# Patient Record
Sex: Female | Born: 1995 | Race: Black or African American | Hispanic: No | Marital: Single | Smoking: Never smoker
Health system: Southern US, Community
[De-identification: ages and names within clinical notes are randomized; demographics above are authoritative.]

## PROBLEM LIST (undated history)

## (undated) DIAGNOSIS — D649 Anemia, unspecified: Secondary | ICD-10-CM

## (undated) HISTORY — PX: BUNIONECTOMY: SHX129

## (undated) HISTORY — PX: ABLATION: SHX5711

## (undated) HISTORY — DX: Anemia, unspecified: D64.9

---

## 2016-12-29 ENCOUNTER — Ambulatory Visit (HOSPITAL_COMMUNITY)
Admission: EM | Admit: 2016-12-29 | Discharge: 2016-12-29 | Disposition: A | Discharge status: Home / Self Care | Payer: BLUE CROSS/BLUE SHIELD | Attending: Family Medicine | Admitting: Family Medicine

## 2016-12-29 ENCOUNTER — Encounter (HOSPITAL_COMMUNITY): Payer: Self-pay | Admitting: *Deleted

## 2016-12-29 DIAGNOSIS — J029 Acute pharyngitis, unspecified: Secondary | ICD-10-CM | POA: Diagnosis not present

## 2016-12-29 MED ORDER — CHLORHEXIDINE GLUCONATE 0.12 % MT SOLN: 15.0000 mL | Freq: Two times a day (BID) | OROMUCOSAL | 0 refills | Status: DC

## 2016-12-29 NOTE — ED Provider Notes (Signed)
  [facility]   [id number] 12/29/16 Arrival Time: 1549   SUBJECTIVE:  Tishara Pizano is a 21 y.o. female who presents to the urgent care with complaint of sore throat for two days.  She has an allergy history to [employer] dough mixture and she did have pizza on Tuesday night. She also has a history of tonsillitis.  Patient has no recent fever, ear pain, cough, rash Patient is a Consulting civil engineer at [employer]     History reviewed. No pertinent past medical history. History reviewed. No pertinent family history. Social History   Social History  . Marital status: Single    Spouse name: N/A  . Number of children: N/A  . Years of education: N/A   Occupational History  . Not on file.   Social History Main Topics  . Smoking status: Never Smoker  . Smokeless tobacco: Never Used  . Alcohol use No  . Drug use: Unknown  . Sexual activity: Not Currently   Other Topics Concern  . Not on file   Social History Narrative  . No narrative on file   No outpatient prescriptions have been marked as taking for the 12/29/16 encounter [facility] Encounter).   No Known Allergies    ROS: As per HPI, remainder of ROS negative.   OBJECTIVE:   Vitals:   12/29/16 1655  BP: 118/72  Pulse: 78  Resp: 18  Temp: 98.6 F (37 C)  TempSrc: Oral  SpO2: 100%     General appearance: alert; no distress Eyes: PERRL; EOMI; conjunctiva normal HENT: normocephalic; atraumatic; TMs normal, canal normal, external ears normal without trauma; nasal mucosa normal; oral mucosa normal Neck: supple Extremities: no cyanosis or edema; symmetrical with no gross deformities Skin: warm and dry Neurologic: normal gait; grossly normal Psychological: alert and cooperative; normal mood and affect       ASSESSMENT & PLAN:  1. Pharyngitis, unspecified etiology     Meds ordered this encounter  Medications  . chlorhexidine (PERIDEX) 0.12 % solution    Sig: Use as directed 15  mLs in the mouth or throat 2 (two) times daily.    Dispense:  120 mL    Refill:  0    Reviewed expectations re: course of current medical issues. Questions answered. Outlined signs and symptoms indicating need for more acute intervention. Patient verbalized understanding. After Visit Summary given.    Procedures:      Elvina Sidle, MD 12/29/16 1700

## 2016-12-29 NOTE — ED Triage Notes (Signed)
Pt reports   Symptoms    Of   sorethroat       And      Tightness  In  His  Throat       After eating     A    Pizza   sev   Days   Ago  She  Reports  She  Is  Allergic  To   Dough     At  This  Time  She  Is   Sitting   Upright  On  The  Exam table   Speaking in   Complete  sentances

## 2016-12-31 LAB — CULTURE, GROUP A STREP (THRC)

## 2017-01-26 ENCOUNTER — Ambulatory Visit (HOSPITAL_COMMUNITY)
Admission: EM | Admit: 2017-01-26 | Discharge: 2017-01-26 | Disposition: A | Discharge status: Home / Self Care | Payer: BLUE CROSS/BLUE SHIELD | Attending: Emergency Medicine | Admitting: Emergency Medicine

## 2017-01-26 ENCOUNTER — Encounter (HOSPITAL_COMMUNITY): Payer: Self-pay | Admitting: Emergency Medicine

## 2017-01-26 ENCOUNTER — Ambulatory Visit (INDEPENDENT_AMBULATORY_CARE_PROVIDER_SITE_OTHER): Payer: BLUE CROSS/BLUE SHIELD

## 2017-01-26 DIAGNOSIS — Z3202 Encounter for pregnancy test, result negative: Secondary | ICD-10-CM | POA: Diagnosis not present

## 2017-01-26 DIAGNOSIS — R1032 Left lower quadrant pain: Secondary | ICD-10-CM

## 2017-01-26 DIAGNOSIS — R1012 Left upper quadrant pain: Secondary | ICD-10-CM | POA: Diagnosis not present

## 2017-01-26 DIAGNOSIS — R109 Unspecified abdominal pain: Secondary | ICD-10-CM | POA: Diagnosis not present

## 2017-01-26 DIAGNOSIS — K5900 Constipation, unspecified: Secondary | ICD-10-CM

## 2017-01-26 LAB — POCT URINALYSIS DIP (DEVICE)
Bilirubin Urine: NEGATIVE
Glucose, UA: NEGATIVE mg/dL
HGB URINE DIPSTICK: NEGATIVE
NITRITE: NEGATIVE
PH: 7 (ref 5.0–8.0)
Protein, ur: NEGATIVE mg/dL
SPECIFIC GRAVITY, URINE: 1.02 (ref 1.005–1.030)
UROBILINOGEN UA: 1 mg/dL (ref 0.0–1.0)

## 2017-01-26 LAB — POCT PREGNANCY, URINE: Preg Test, Ur: NEGATIVE

## 2017-01-26 MED ORDER — DOCUSATE SODIUM 100 MG PO CAPS: 100.0000 mg | ORAL_CAPSULE | Freq: Two times a day (BID) | ORAL | 0 refills | Status: DC | PRN

## 2017-01-26 NOTE — Discharge Instructions (Signed)
Increase hydration and dietary fiber.  May start Metamucil OTC  If pain worsens will return to clinic or go to ED Will send Urine for Culture and call with results

## 2017-01-26 NOTE — ED Provider Notes (Signed)
[facility]    CSN: [id number] Arrival date & time: 01/26/17  1006     History   Chief Complaint Chief Complaint  Patient presents with  . Abdominal Pain    HPI Ashley Herman is a 21 y.o. female presented to clinic with CC of left upper and left lower abdominal pain, radiating to back since last night. Denies N/V/D. Denies fever/chills.  Denies urinary Sx or vaginal discharge.  Reports BM irregular, had small BM today morning, but stool was hard and forced to pass stool.  Abdomen tender to light and deep palpation to left upper and lower quadrant. Mild guarding. No rebound or rigidity appreciated. No acute visible distress.   The history is provided by the patient.  Abdominal Pain  Pain location:  LUQ and LLQ Pain quality: cramping   Pain radiates to:  Back Pain severity:  Moderate Onset quality:  Sudden Duration:  1 day Timing:  Intermittent Progression:  Waxing and waning Chronicity:  New Relieved by:  Nothing Worsened by:  Urination and position changes   History reviewed. No pertinent past medical history.  There are no active problems to display for this patient.   History reviewed. No pertinent surgical history.  OB History    No data available       Home Medications    Prior to Admission medications   Medication Sig Start Date End Date Taking? Authorizing Provider  chlorhexidine (PERIDEX) 0.12 % solution Use as directed 15 mLs in the mouth or throat 2 (two) times daily. 12/29/16   Elvina Sidle, MD  docusate sodium (COLACE) 100 MG capsule Take 1 capsule (100 mg total) by mouth 2 (two) times daily as needed for mild constipation. 01/26/17   Yarlin Breisch, NP    Family History History reviewed. No pertinent family history.  Social History Social History  Substance Use Topics  . Smoking status: Never Smoker  . Smokeless tobacco: Never Used  . Alcohol use No     Allergies   Patient has no known allergies.   Review of  Systems Review of Systems  Constitutional: Negative.   HENT: Negative.   Eyes: Negative.   Respiratory: Negative.   Cardiovascular: Negative.   Gastrointestinal: Positive for abdominal pain.  Genitourinary: Negative.   Neurological: Negative.      Physical Exam Triage Vital Signs ED Triage Vitals [01/26/17 1034]  Enc Vitals Group     BP 128/67     Pulse Rate (!) 109     Resp 20     Temp 98 F (36.7 C)     Temp Source Oral     SpO2 100 %     Weight      Height      Head Circumference      Peak Flow      Pain Score      Pain Loc      Pain Edu?      Excl. in GC?    No data found.   Updated Vital Signs BP 128/67 (BP Location: Left Arm)   Pulse (!) 109   Temp 98 F (36.7 C) (Oral)   Resp 20   LMP 01/13/2017 (Exact Date)   SpO2 100%   Visual Acuity Right Eye Distance:   Left Eye Distance:   Bilateral Distance:    Right Eye Near:   Left Eye Near:    Bilateral Near:     Physical Exam  Constitutional: She is oriented to person, place, and time. She  appears well-developed and well-nourished. No distress.  HENT:  Head: Normocephalic.  Eyes: Pupils are equal, round, and reactive to light. EOM are normal.  Neck: Normal range of motion.  Cardiovascular: Normal rate and regular rhythm.   Pulmonary/Chest: Effort normal and breath sounds normal.  Abdominal: Soft. She exhibits no distension and no mass. There is tenderness (Tenderness to light and deep palpation on LUQ and LLQ). There is guarding. There is no rebound. No hernia.  Neurological: She is alert and oriented to person, place, and time.  Skin: Skin is warm.     UC Treatments / Results  Labs (all labs ordered are listed, but only abnormal results are displayed) Labs Reviewed  POCT URINALYSIS DIP (DEVICE) - Abnormal; Notable for the following:       Result Value   Ketones, ur TRACE (*)    Leukocytes, UA TRACE (*)    All other components within normal limits  POCT PREGNANCY, URINE    EKG  EKG  Interpretation None       Radiology Dg Abd 2 Views  Result Date: 01/26/2017 CLINICAL DATA:  Pain after eating. EXAM: ABDOMEN - 2 VIEW COMPARISON:  No prior . FINDINGS: Soft tissue structures are unremarkable. No bowel distention. Stool noted throughout colon. No free air. No acute bony abnormality . IMPRESSION: No acute abnormality. Electronically Signed   By: Maisie Fushomas  Register   On: 01/26/2017 11:46    Procedures Procedures (including critical care time)  Medications Ordered in UC Medications - No data to display   Initial Impression / Assessment and Plan / UC Course  I have reviewed the triage vital signs and the nursing notes.  Pertinent labs & imaging results that were available during my care of the patient were reviewed by me and considered in my medical decision making (see chart for details).   Sx highly likely from constipation or possible UTI as per leukocyturia on dipstick.  Final Clinical Impressions(s) / UC Diagnoses   Final diagnoses:  Left upper quadrant pain  Abdominal pain, LLQ (left lower quadrant)  Constipation, unspecified constipation type    New Prescriptions New Prescriptions   DOCUSATE SODIUM (COLACE) 100 MG CAPSULE    Take 1 capsule (100 mg total) by mouth 2 (two) times daily as needed for mild constipation.   XR: Abdomen: Soft tissue structures are unremarkable. No bowel distention. Stool noted throughout colon. No free air. No acute bony abnormality  Will send urine for culture and ABX if bacteria in urine.  Controlled Substance Prescriptions [place] Controlled Substance Registry consulted? Not Applicable   Reinaldo RaddleMultani, Kyllie Pettijohn, NP 01/26/17 1159

## 2017-01-26 NOTE — ED Triage Notes (Signed)
Pt c/o intermittent lower abd pain onset yest .... Sts pain radiates to the back  Sts she is unable to lay on side or back when having pain  Repots abd discomfort when she voids urine but denies dysuria, hematuria, urinary fre/urgency, n/v, d, vag d/c  LBM = today but was straining.   Sexually active in a monogamous relationship x2 months and uses condoms all the time.   A&O x4... NAD... Ambulatory

## 2017-01-31 ENCOUNTER — Encounter (HOSPITAL_COMMUNITY): Payer: Self-pay

## 2017-01-31 ENCOUNTER — Emergency Department (HOSPITAL_COMMUNITY)
Admission: EM | Admit: 2017-01-31 | Discharge: 2017-02-01 | Disposition: A | Discharge status: Home / Self Care | Payer: BLUE CROSS/BLUE SHIELD | Attending: Emergency Medicine | Admitting: Emergency Medicine

## 2017-01-31 DIAGNOSIS — R1031 Right lower quadrant pain: Secondary | ICD-10-CM | POA: Diagnosis present

## 2017-01-31 DIAGNOSIS — N83201 Unspecified ovarian cyst, right side: Secondary | ICD-10-CM | POA: Insufficient documentation

## 2017-01-31 LAB — URINALYSIS, ROUTINE W REFLEX MICROSCOPIC
Bilirubin Urine: NEGATIVE
GLUCOSE, UA: NEGATIVE mg/dL
HGB URINE DIPSTICK: NEGATIVE
Ketones, ur: 20 mg/dL — AB
Leukocytes, UA: NEGATIVE
NITRITE: NEGATIVE
PH: 5 (ref 5.0–8.0)
Protein, ur: 100 mg/dL — AB
SPECIFIC GRAVITY, URINE: 1.03 (ref 1.005–1.030)

## 2017-01-31 LAB — CBC
HCT: 33.6 % — ABNORMAL LOW (ref 36.0–46.0)
Hemoglobin: 10.7 g/dL — ABNORMAL LOW (ref 12.0–15.0)
MCH: 26 pg (ref 26.0–34.0)
MCHC: 31.8 g/dL (ref 30.0–36.0)
MCV: 81.6 fL (ref 78.0–100.0)
PLATELETS: 498 10*3/uL — AB (ref 150–400)
RBC: 4.12 MIL/uL (ref 3.87–5.11)
RDW: 14.9 % (ref 11.5–15.5)
WBC: 13.7 10*3/uL — ABNORMAL HIGH (ref 4.0–10.5)

## 2017-01-31 LAB — COMPREHENSIVE METABOLIC PANEL
ALBUMIN: 3.8 g/dL (ref 3.5–5.0)
ALK PHOS: 59 U/L (ref 38–126)
ALT: 10 U/L — ABNORMAL LOW (ref 14–54)
ANION GAP: 10 (ref 5–15)
AST: 18 U/L (ref 15–41)
BILIRUBIN TOTAL: 0.4 mg/dL (ref 0.3–1.2)
BUN: 5 mg/dL — AB (ref 6–20)
CALCIUM: 9.1 mg/dL (ref 8.9–10.3)
CO2: 22 mmol/L (ref 22–32)
CREATININE: 0.69 mg/dL (ref 0.44–1.00)
Chloride: 102 mmol/L (ref 101–111)
GFR calc non Af Amer: >60 mL/min (ref >60)
GLUCOSE: 96 mg/dL (ref 65–99)
Potassium: 3.5 mmol/L (ref 3.5–5.1)
Sodium: 134 mmol/L — ABNORMAL LOW (ref 135–145)
TOTAL PROTEIN: 7.9 g/dL (ref 6.5–8.1)

## 2017-01-31 LAB — I-STAT BETA HCG BLOOD, ED (MC, WL, AP ONLY)

## 2017-01-31 LAB — LIPASE, BLOOD: Lipase: 23 U/L (ref 11–51)

## 2017-01-31 MED ORDER — IOPAMIDOL (ISOVUE-300) INJECTION 61%
INTRAVENOUS | Status: AC
  Administered 2017-02-01: 100 mL
  Filled 2017-01-31: qty 100

## 2017-01-31 NOTE — ED Provider Notes (Signed)
[facility] EMERGENCY DEPARTMENT Provider Note   CSN: [id number] Arrival date & time: 01/31/17  1654     History   Chief Complaint Chief Complaint  Patient presents with  . Abdominal Pain  . Back Pain    HPI Ashley Herman is a 21 y.o. female.  Patient presents for evaluation of RLQ abdominal pain x 3 days. She denies fever, nausea, vomiting or diarrhea. She reports anorexia for the first 2 days. No vaginal discharge or urinary symptoms. She reports certain positions, going over bumps in the road make the pain worse. She was seen last week at Urgent Care with left sided abdominal pain and diagnosed with constipation. She states she took the medications and recommendations for treatment and feels condition has resolved. Pain radiates to right lower back. No flank pain.   The history is provided by the patient. No language interpreter was used.  Abdominal Pain   Pertinent negatives include fever, diarrhea, nausea, vomiting, constipation and dysuria.  Back Pain   Associated symptoms include abdominal pain. Pertinent negatives include no chest pain, no fever, no dysuria, no pelvic pain and no weakness.    History reviewed. No pertinent past medical history.  There are no active problems to display for this patient.   History reviewed. No pertinent surgical history.  OB History    No data available       Home Medications    Prior to Admission medications   Medication Sig Start Date End Date Taking? Authorizing Provider  chlorhexidine (PERIDEX) 0.12 % solution Use as directed 15 mLs in the mouth or throat 2 (two) times daily. Patient not taking: Reported on 01/31/2017 12/29/16   Elvina Sidle, MD  docusate sodium (COLACE) 100 MG capsule Take 1 capsule (100 mg total) by mouth 2 (two) times daily as needed for mild constipation. Patient not taking: Reported on 01/31/2017 01/26/17   Reinaldo Raddle, NP    Family History No family history on  file.  Social History Social History  Substance Use Topics  . Smoking status: Never Smoker  . Smokeless tobacco: Never Used  . Alcohol use No     Allergies   Patient has no known allergies.   Review of Systems Review of Systems  Constitutional: Positive for appetite change. Negative for chills and fever.  Respiratory: Negative.  Negative for shortness of breath.   Cardiovascular: Negative.  Negative for chest pain.  Gastrointestinal: Positive for abdominal pain. Negative for constipation, diarrhea, nausea and vomiting.  Genitourinary: Negative.  Negative for dysuria, flank pain, pelvic pain and vaginal discharge.  Musculoskeletal: Positive for back pain.  Skin: Negative.   Neurological: Negative.  Negative for weakness and light-headedness.     Physical Exam Updated Vital Signs BP 119/62   Pulse 97   Temp 98.3 F (36.8 C) (Oral)   Resp 16   Ht 5\' 1"  (1.549 m)   Wt 51.7 kg (114 lb)   LMP 01/13/2017 (Exact Date)   SpO2 98%   BMI 21.54 kg/m   Physical Exam  Constitutional: She is oriented to person, place, and time. She appears well-developed and well-nourished.  Neck: Normal range of motion.  Pulmonary/Chest: Effort normal.  Abdominal: Soft. She exhibits no distension and no mass. There is tenderness (Right sided abdominal tenderness with guarding of the RLQ. ). There is guarding.  Musculoskeletal: Normal range of motion.  Neurological: She is alert and oriented to person, place, and time.  Skin: Skin is warm and dry.  ED Treatments / Results  Labs (all labs ordered are listed, but only abnormal results are displayed) Labs Reviewed  COMPREHENSIVE METABOLIC PANEL - Abnormal; Notable for the following:       Result Value   Sodium 134 (*)    BUN 5 (*)    ALT 10 (*)    All other components within normal limits  CBC - Abnormal; Notable for the following:    WBC 13.7 (*)    Hemoglobin 10.7 (*)    HCT 33.6 (*)    Platelets 498 (*)    All other components  within normal limits  URINALYSIS, ROUTINE W REFLEX MICROSCOPIC - Abnormal; Notable for the following:    APPearance HAZY (*)    Ketones, ur 20 (*)    Protein, ur 100 (*)    Bacteria, UA FEW (*)    Squamous Epithelial / LPF 0-5 (*)    All other components within normal limits  LIPASE, BLOOD  I-STAT BETA HCG BLOOD, ED ([facility], [facility], [facility] ONLY)    EKG  EKG Interpretation None       Radiology No results found.  Procedures Procedures (including critical care time)  Medications Ordered in ED Medications - No data to display   Initial Impression / Assessment and Plan / ED Course  I have reviewed the triage vital signs and the nursing notes.  Pertinent labs & imaging results that were available during my care of the patient were reviewed by me and considered in my medical decision making (see chart for details).     Patient presents with onset of RLQ abdominal pain x 4 days. She has had anorexia, although improved. No fever. She reports pain when going over bumps in the road.   On exam, pain localizes to the RLQ and is referred to RLQ throughout the abdomen. CT ordered to r/o appy. No vaginal symptoms.   CT negative for appy, positive for right ovarian cyst. Do not suspect infection. Will discharge home with recommendation for OB/GYN follow up.    Final Clinical Impressions(s) / ED Diagnoses   Final diagnoses:  None   1. Right ovarian cyst.  New Prescriptions New Prescriptions   No medications on file     Elpidio AnisUpstill, Sal Spratley, Cordelia Poche-C 02/02/17 0755    Vanetta MuldersZackowski, Scott, MD 02/02/17 [phone]

## 2017-01-31 NOTE — ED Triage Notes (Signed)
Pt reports RIGHT groin and right lower back pain since Saturday. Denies N/V/D. No urinary symptoms.

## 2017-02-01 ENCOUNTER — Emergency Department (HOSPITAL_COMMUNITY): Payer: BLUE CROSS/BLUE SHIELD

## 2017-02-01 MED ORDER — IBUPROFEN 600 MG PO TABS: 600.0000 mg | ORAL_TABLET | Freq: Four times a day (QID) | ORAL | 0 refills | Status: DC | PRN

## 2017-02-01 NOTE — Discharge Instructions (Signed)
Follow up with gynecology if pain does not improve over the next few days. Return to the emergency department with any fever, severe pain or new concern.

## 2017-02-01 NOTE — ED Notes (Signed)
Patient transported to CT 

## 2017-03-01 ENCOUNTER — Ambulatory Visit: Payer: BLUE CROSS/BLUE SHIELD | Admitting: Family Medicine

## 2017-03-01 ENCOUNTER — Encounter: Payer: Self-pay | Admitting: Family Medicine

## 2017-03-01 VITALS — BP 96/56 | HR 93 | Temp 98.0°F | Resp 16 | Ht 61.0 in | Wt 112.0 lb

## 2017-03-01 DIAGNOSIS — R42 Dizziness and giddiness: Secondary | ICD-10-CM | POA: Diagnosis not present

## 2017-03-01 DIAGNOSIS — R5383 Other fatigue: Secondary | ICD-10-CM | POA: Diagnosis not present

## 2017-03-01 DIAGNOSIS — N921 Excessive and frequent menstruation with irregular cycle: Secondary | ICD-10-CM

## 2017-03-01 LAB — GLUCOSE, CAPILLARY: Glucose-Capillary: 90 mg/dL (ref 65–99)

## 2017-03-01 NOTE — Patient Instructions (Addendum)
We will follow-up by phone with any abnormal laboratory results. Recommend a balanced diet divided over 5-6 small meals throughout the day.  Supplement meals with protein shakes during times when it is difficult to tolerate solids.  Also recommend a daily multivitamin. Follow-up in gynecology as previously scheduled. Multivitamin with Minerals and Iron formulations (oral solid dosage forms) What is this medicine? MULTIVITAMIN with MINERAL and IRON combinations are used to help provide good nutrition. This medicine may be used for other purposes; ask your health care provider or pharmacist if you have questions. COMMON BRAND NAME(S): Active FE, Androvite, Bacmin, Caromega, Centrum, Teacher, English as a foreign language, [employer], Administrator, sports, Government social research officer with [employer], Daily Multiple, Daily Multiple Vitamins with Minerals, Daily Vite plus Iron, Ferrocite Plus, Generix-T, Geritol, Geritol Multivitamin, Integra Plus, Megavite Multivitamin, Multilex, Multilex T&M, Multivitamin With Minerals, Niva-Plus, Nu-Iron V, One-Daily, Optivite PMT, PNV Prenatal Plus Multivitamin, Prenatal Plus, Prenatal Plus Low Iron, PreNatal Vitamins Plus, PrePLUS, Strovite Forte, Super [place], [place] Advanced, Thera-M, Thera-Tab M High Potency, Therapeutic-M, Therems, Therems-H, Therems-M, Uni-Thera M Advanced, Unicap M, Unicap SR, Unicomplex-M, Vitafol, Vol-Plus What should I tell my health care provider before I take this medicine? They need to know if you have any of these conditions: -bleeding or clotting disorder -history of anemia of any type -other chronic health condition -an unusual or allergic reaction to vitamins, minerals, other medicines, foods, dyes, or preservatives -pregnant or trying to get pregnant -breast-feeding How should I use this medicine? Take by mouth with a glass of water. May take with food. Some brands, but not all, may be chewed before swallowing. Follow  the directions on the prescription or product label. The usual dose is one tablet once a day. Do not take your medicine more often than directed. Contact your pediatrician regarding the use of this medicine in children. Special care may be needed. Overdosage: If you think you have taken too much of this medicine contact a poison control center or emergency room at once. NOTE: This medicine is only for you. Do not share this medicine with others. What if I miss a dose? If you miss a dose, take it as soon as you can. If it is almost time for your next dose, take only that dose. Do not take double or extra doses. What may interact with this medicine? -antacids -cefdinir -cefditoren -etidronate -fluoroquinolone antibiotics (examples: ciprofloxacin, gatifloxacin, levofloxacin) -levodopa -tetracycline antibiotics (examples: doxycycline, minocycline, tetracycline) -thyroid hormones -warfarin This list may not describe all possible interactions. Give your health care provider a list of all the medicines, herbs, non-prescription drugs, or dietary supplements you use. Also tell them if you smoke, drink alcohol, or use illegal drugs. Some items may interact with your medicine. What should I watch for while using this medicine? Get regular checks on your progress. Remember that vitamin and mineral supplements do not replace the need for good nutrition from a balanced diet. Talk with your health care professional if you have questions or need advice. What side effects may I notice from receiving this medicine? Side effects that you should report to your doctor or health care professional as soon as possible: -allergic reaction such as skin rash or difficulty breathing -vomiting Side effects that usually do not require medical attention (report to your doctor or health care professional if they continue or are bothersome): -nausea -stomach upset This list may not describe all possible side effects. Call  your doctor for medical advice about side effects. You may report side effects  to FDA at 1-800-FDA-1088. Where should I keep my medicine? Keep out of the reach of children. Most vitamins and minerals should be stored at controlled room temperature between 15 and 30 degrees C (59 and 86 degrees F). Check your specific product directions. Protect from heat and moisture. Throw away any unused medicine after the expiration date. NOTE: This sheet is a summary. It may not cover all possible information. If you have questions about this medicine, talk to your doctor, pharmacist, or health care provider.  2018 Elsevier/Gold Standard (2014-10-23 08:55:11) Protein Supplement Powders What are protein supplement powders? Protein is one of the major components of the human diet. It helps your body to build tissue, muscle, red blood cells, enzymes, antibodies, and hormones. Athletes need to include more protein in their diets than nonathletes do. Athletes place more stress on their muscles, and extra protein helps to repair these muscles. In female athletes, extra protein also helps to maintain regular menstrual cycles. Most of an athlete's daily protein needs can be met through food. However, protein supplement powders are a safe and effective way to increase daily protein intake, particularly if you:  Eat a vegan diet.  Are actively trying to build muscle mass.  Are growing.  Are recovering from injury.  Protein supplement powders are supplements that contain large amounts of protein. They can be easily digested, absorbed, and used by the body. These proteins contain the essential building blocks (amino acids) that are central to muscle function, repair, and recovery. Protein supplement powders most often consist of:  Animal-based proteins, including: ? Whey. ? Casein. ? Milk. ? Egg.  Vegetable-based proteins, including soy.  How do I use protein supplement powders? Many protein supplement  powders need to be mixed with water. Some products come premixed in a liquid form. Specific proportions and directions vary by product. Follow the directions that are provided on the label. Eat carbohydrates and protein, either in food or supplement form, before and after an exercise session. Consuming a protein supplement powder before lifting weights or doing resistance training helps to build muscles (protein synthesis) and may limit muscle damage during exercise. Eating a protein supplement powder within three hours after exercise can maximize muscle protein synthesis. Before and after a workout, eat protein in a ratio of one protein for every three carbohydrates. For example, one serving of protein powder that contains 20 grams of protein should be consumed with 60 grams of carbohydrates. This is equivalent to one banana and one piece of toast. How much protein supplement powder should I take? Follow the instructions on the product label for recommended serving sizes and daily servings. Protein supplement powders should be taken in addition to, not in place of, a balanced diet to meet daily protein needs. Daily protein recommendations for adults are as follows:  Average adults should consume around 0.36 grams of protein daily per pound of body weight. For example, a 150-pound adult should consume approximately 54 grams of protein each day.  Athletes should consume 0.45-0.73 grams of protein daily per pound of body weight. For example, a 150-pound athlete should consume 67.5-109.5 grams of protein each day.  What are the risks of taking protein supplement powders? The risks of taking a protein supplement powder may vary depending on the type of protein in the supplement. Risks may include:  Allergies. ? If you have an allergy to cow's milk, avoid protein supplements that contain milk, whey, or casein. ? If you have an allergy to soy, avoid protein  supplements that contain soy or lecithin.  Extra  stress on your kidneys, if you have kidney disease or diabetes.  If you eat too much protein, or if your protein intake is not balanced with carbohydrates and other nutrients, you may have:  Gastrointestinal discomfort.  Constipation.  Diarrhea.  Tissue dehydration.  Nausea.  Headache.  Fatigue.  What are some tips for using protein supplement powders?  Always talk with your health care provider before starting protein supplement powders.  Eat a balanced diet of fruits, vegetables, meat, dairy, and grains, in addition to taking a protein supplement powder.  Drink enough fluid to keep your urine clear or pale yellow. Drink fluids before, during, and after exercise and throughout the day.  Take protein supplements only as directed. Do not take a higher dose than was recommended for you. Protein supplement powders are not regulated. Some products may contain impurities or ingredients that differ from those on the label.  Try to meet your daily protein needs with food. Eat protein-rich foods along with protein supplement powders. Foods high in protein include ? Lean meat, poultry, and fish. ? Eggs. ? Milk, yogurt, and cheese. ? Soybeans and tofu. ? Beans and lentils. This information is not intended to replace advice given to you by your health care provider. Make sure you discuss any questions you have with your health care provider. Document Released: 03/28/2005 Document Revised: 02/23/2016 Document Reviewed: 09/10/2013 Elsevier Interactive Patient Education  2017 [employer].

## 2017-03-01 NOTE — Progress Notes (Signed)
Subjective:    Patient ID: Ashley Herman, female    DOB: 25-Jan-1996, 21 y.o.   MRN: [id number]  HPI  Ashley Herman, 21 year old female that presents with complaint of 1 month to establish care.  Patient junior at [employer] and has not had a primary provider in the area.  Patient is currently complaining of periodic fatigue and insomnia.  She also endorses a decreased appetite symptoms of fatigue began 2-3 months ago.   Sentinal symptom the patient feels fatigue began with: excessive menstrual bleeding.  She was found to have cyst on ovaries.  Patient was started on oral contraceptive by gynecology.  Oral contraceptive was discontinued due to unwanted side effects.  Symptoms of her fatigue have been change in appetite, diffuse soft tissue aches and pains, fatigue with paradoxical insomnia, general malaise and lack of interest in usual activities. Patient describes the following psychologic symptoms: depression and stress at school.  Patient denies symptoms of arthritis, exercise intolerance, unusual rashes and witnessed or suspected sleep apnea. Severity has been symptoms bothersome, but easily able to carry out all usual work/school/family activities.  Past Medical History:  Diagnosis Date  . Anemia    There is no immunization history on file for this patient.  Social History   Socioeconomic History  . Marital status: Single    Spouse name: Not on file  . Number of children: Not on file  . Years of education: Not on file  . Highest education level: Not on file  Social Needs  . Financial resource strain: Not on file  . Food insecurity - worry: Not on file  . Food insecurity - inability: Not on file  . Transportation needs - medical: Not on file  . Transportation needs - non-medical: Not on file  Occupational History  . Not on file  Tobacco Use  . Smoking status: Never Smoker  . Smokeless tobacco: Never Used  Substance and Sexual Activity  . Alcohol use: No  . Drug use: No   . Sexual activity: Yes    Birth control/protection: Condom  Other Topics Concern  . Not on file  Social History Narrative  . Not on file  No Known Allergies [facility] 03/01/2017 03/01/2017  Decreased Interest 0 0  Down, Depressed, Hopeless 0 1  PHQ - 2 Score 0 1  Altered sleeping 0 -  Tired, decreased energy 0 -  Change in appetite 0 -  Trouble concentrating 0 -  Moving slowly or fidgety/restless 0 -  Suicidal thoughts 0 -  PHQ-9 Score 0 -  Difficult doing work/chores Not difficult at all -    Review of Systems  Constitutional: Positive for fatigue. Negative for fever and unexpected weight change.  Eyes: Negative for photophobia and visual disturbance.  Respiratory: Negative for wheezing.   Cardiovascular: Negative.  Negative for chest pain, palpitations and leg swelling.  Gastrointestinal: Negative.  Negative for constipation, diarrhea, nausea, rectal pain and vomiting.  Endocrine: Negative for polydipsia, polyphagia and polyuria.  Musculoskeletal: Negative.  Negative for arthralgias, back pain, gait problem, joint swelling, myalgias, neck pain and neck stiffness.  Skin: Negative.   Neurological: Positive for dizziness and light-headedness. Negative for tremors, syncope, weakness and headaches.  Hematological: Negative.   Psychiatric/Behavioral: Negative.  Negative for sleep disturbance and suicidal ideas.       Situational depression      Objective:   Physical Exam  Constitutional: She is oriented to person, place, and time. She appears well-developed and well-nourished.  HENT:  Head: Normocephalic and atraumatic.  Right Ear: External ear normal.  Left Ear: External ear normal.  Nose: Nose normal.  Mouth/Throat: Oropharynx is clear and moist.  Eyes: Conjunctivae and EOM are normal. Pupils are equal, round, and reactive to light.  Neck: Normal range of motion. Neck supple.  Cardiovascular: Normal rate, normal heart sounds and intact distal pulses.   Pulmonary/Chest: Effort normal and breath sounds normal.  Abdominal: Soft. Bowel sounds are normal.  Musculoskeletal: Normal range of motion.  Neurological: She is alert and oriented to person, place, and time. She has normal reflexes.  Skin: Skin is warm and dry.  Psychiatric: She has a normal mood and affect. Her behavior is normal. Judgment and thought content normal.     BP (!) 96/56 (BP Location: Left Arm, Patient Position: Sitting, Cuff Size: Normal)   Pulse 93   Temp 98 F (36.7 C) (Oral)   Resp 16   Ht 5\' 1"  (1.549 m)   Wt 112 lb (50.8 kg)   LMP 02/01/2017   SpO2 100%   BMI 21.16 kg/m  Assessment & Plan:  1. Dizziness, nonspecific Reviewed EKG, borderline. - CBC with Differential - Glucose (CBG) - EKG 12-Lead  2. Menorrhagia with irregular cycle Follow-up in gynecology as previously scheduled.  3. Other fatigue Recommended daily multivitamin and a balanced diet. Body mass index is 21.16 kg/m.  - CBC with Differential - COMPLETE METABOLIC PANEL WITH GFR - Vitamin D, 25-hydroxy - Vitamin B12 - TSH   RTC: 1 month for fatigue.  We will follow-up by phone with any abnormal lab results    Nolon NationsLaChina Moore Yarianna Varble  MSN, FNP-C [facility] [address]  [place], [place] [id number] [phone]

## 2017-03-02 LAB — CBC WITH DIFFERENTIAL/PLATELET
BASOS ABS: 92 {cells}/uL (ref 0–200)
BASOS PCT: 1.5 %
EOS ABS: 616 {cells}/uL — AB (ref 15–500)
Eosinophils Relative: 10.1 %
HEMATOCRIT: 33.9 % — AB (ref 35.0–45.0)
HEMOGLOBIN: 10.9 g/dL — AB (ref 11.7–15.5)
Lymphs Abs: 1976 cells/uL (ref 850–3900)
MCH: 25.4 pg — AB (ref 27.0–33.0)
MCHC: 32.2 g/dL (ref 32.0–36.0)
MCV: 79 fL — AB (ref 80.0–100.0)
MPV: 9.4 fL (ref 7.5–12.5)
Monocytes Relative: 9.8 %
NEUTROS ABS: 2818 {cells}/uL (ref 1500–7800)
Neutrophils Relative %: 46.2 %
Platelets: 532 10*3/uL — ABNORMAL HIGH (ref 140–400)
RBC: 4.29 10*6/uL (ref 3.80–5.10)
RDW: 14.2 % (ref 11.0–15.0)
Total Lymphocyte: 32.4 %
WBC: 6.1 10*3/uL (ref 3.8–10.8)
WBCMIX: 598 {cells}/uL (ref 200–950)

## 2017-03-02 LAB — COMPLETE METABOLIC PANEL WITH GFR
AG RATIO: 1.2 (calc) (ref 1.0–2.5)
ALBUMIN MSPROF: 4.3 g/dL (ref 3.6–5.1)
ALKALINE PHOSPHATASE (APISO): 66 U/L (ref 33–115)
ALT: 17 U/L (ref 6–29)
AST: 22 U/L (ref 10–30)
BILIRUBIN TOTAL: 0.4 mg/dL (ref 0.2–1.2)
BUN: 9 mg/dL (ref 7–25)
CHLORIDE: 101 mmol/L (ref 98–110)
CO2: 28 mmol/L (ref 20–32)
Calcium: 10.3 mg/dL — ABNORMAL HIGH (ref 8.6–10.2)
Creat: 0.72 mg/dL (ref 0.50–1.10)
GFR, EST AFRICAN AMERICAN: 140 mL/min/{1.73_m2} (ref >=60)
GFR, Est Non African American: 121 mL/min/{1.73_m2} (ref >=60)
GLOBULIN: 3.6 g/dL (ref 1.9–3.7)
GLUCOSE: 89 mg/dL (ref 65–99)
Potassium: 4.9 mmol/L (ref 3.5–5.3)
SODIUM: 136 mmol/L (ref 135–146)
TOTAL PROTEIN: 7.9 g/dL (ref 6.1–8.1)

## 2017-03-02 LAB — TSH: TSH: 0.58 mIU/L

## 2017-03-02 LAB — VITAMIN D 25 HYDROXY (VIT D DEFICIENCY, FRACTURES): VIT D 25 HYDROXY: 26 ng/mL — AB (ref 30–100)

## 2017-03-02 LAB — VITAMIN B12: Vitamin B-12: 1244 pg/mL — ABNORMAL HIGH (ref 200–1100)

## 2017-03-07 ENCOUNTER — Other Ambulatory Visit: Payer: Self-pay | Admitting: Family Medicine

## 2017-03-07 ENCOUNTER — Telehealth: Payer: Self-pay

## 2017-03-07 DIAGNOSIS — D509 Iron deficiency anemia, unspecified: Secondary | ICD-10-CM

## 2017-03-07 DIAGNOSIS — E559 Vitamin D deficiency, unspecified: Secondary | ICD-10-CM | POA: Insufficient documentation

## 2017-03-07 MED ORDER — FERROUS SULFATE 325 (65 FE) MG PO TABS: 325.0000 mg | ORAL_TABLET | Freq: Every day | ORAL | 3 refills | Status: DC

## 2017-03-07 MED ORDER — ERGOCALCIFEROL 1.25 MG (50000 UT) PO CAPS: 50000.0000 [IU] | ORAL_CAPSULE | ORAL | 3 refills | Status: DC

## 2017-03-07 NOTE — Telephone Encounter (Signed)
Called and spoke with patient, advised that labs showed vitamin D deficiency and iron deficiency anemia. Advised that patient should start ferrous sulfate 325mg  once daily for iron deficiency and vitamin D once weekly. Recommended that patient include iron rich foods in her diet such as green leafy veggies, organ meats, and beans. Asked patient to keep next scheduled appointment. Thanks!

## 2017-03-07 NOTE — Progress Notes (Signed)
Meds ordered this encounter  Medications  . ferrous sulfate 325 (65 FE) MG tablet    Sig: Take 1 tablet (325 mg total) by mouth daily.    Dispense:  30 tablet    Refill:  3  . ergocalciferol (DRISDOL) 50000 units capsule    Sig: Take 1 capsule (50,000 Units total) by mouth once a week.    Dispense:  12 capsule    Refill:  3    Nolon NationsLaChina Moore Manpreet Kemmer  MSN, FNP-C Patient [facility] [address]  [place], [place] [address] [phone]

## 2017-03-07 NOTE — Telephone Encounter (Signed)
-----   Message from Massie MaroonLachina M Hollis, [place] sent at 03/07/2017  5:27 AM EST ----- Please inform patient that labs showed a vitamin D deficiency and iron deficiency anemia. Will start ferrous sulfate 325 mg daily for iron deficiency. Also recommend an iron rich diet (green leafy veggies, organ meats, beans, etc). Also, will start vitamin D 50, 000 IU weekly.  Will follow up as previously scheduled.   Thanks

## 2017-03-09 ENCOUNTER — Ambulatory Visit (INDEPENDENT_AMBULATORY_CARE_PROVIDER_SITE_OTHER): Payer: BLUE CROSS/BLUE SHIELD | Admitting: Family Medicine

## 2017-03-09 ENCOUNTER — Encounter: Payer: Self-pay | Admitting: Family Medicine

## 2017-03-09 ENCOUNTER — Other Ambulatory Visit (HOSPITAL_COMMUNITY)
Admission: RE | Admit: 2017-03-09 | Discharge: 2017-03-09 | Disposition: A | Discharge status: Home / Self Care | Payer: BLUE CROSS/BLUE SHIELD | Source: Ambulatory Visit | Attending: Family Medicine | Admitting: Family Medicine

## 2017-03-09 VITALS — BP 119/52 | HR 101 | Temp 97.9°F | Resp 16 | Ht 61.0 in | Wt 113.0 lb

## 2017-03-09 DIAGNOSIS — Z202 Contact with and (suspected) exposure to infections with a predominantly sexual mode of transmission: Secondary | ICD-10-CM | POA: Diagnosis present

## 2017-03-09 DIAGNOSIS — R300 Dysuria: Secondary | ICD-10-CM | POA: Diagnosis not present

## 2017-03-09 NOTE — Progress Notes (Signed)
Subjective:    Patient ID: Ashley Herman, female    DOB: 04-Jul-1995, 21 y.o.   MRN: [id number]  HPI Ashley Herman, a 21 year old female presents complaining of a possible exposure to a sexually transmitted infection.  Patient is complaining of dysuria, she states that she was exposed to chlamydia greater than a month ago and was treated with 1 g of azithromycin.  Patient states that dysuria started 2 days ago.  She denies urinary frequency, urgency, vaginal discharge, vaginal burning or dyspareunia.  Patient endorses unprotected sexual intercourse.   Past Medical History:  Diagnosis Date  . Anemia    Social History   Socioeconomic History  . Marital status: Single    Spouse name: Not on file  . Number of children: Not on file  . Years of education: Not on file  . Highest education level: Not on file  Social Needs  . Financial resource strain: Not on file  . Food insecurity - worry: Not on file  . Food insecurity - inability: Not on file  . Transportation needs - medical: Not on file  . Transportation needs - non-medical: Not on file  Occupational History  . Not on file  Tobacco Use  . Smoking status: Never Smoker  . Smokeless tobacco: Never Used  Substance and Sexual Activity  . Alcohol use: No  . Drug use: No  . Sexual activity: Yes    Birth control/protection: Condom  Other Topics Concern  . Not on file  Social History Narrative  . Not on file   There is no immunization history on file for this patient.  No Known Allergies  Review of Systems  Constitutional: Negative.   HENT: Negative.   Eyes: Negative.  Negative for photophobia, redness and visual disturbance.  Respiratory: Negative.   Cardiovascular: Negative.   Gastrointestinal: Negative.   Genitourinary: Positive for dysuria. Negative for difficulty urinating, dyspareunia, enuresis, flank pain, frequency, genital sores, hematuria, menstrual problem and pelvic pain.  Musculoskeletal: Negative.   Skin:  Negative.   Allergic/Immunologic: Negative.   Neurological: Negative.   Hematological: Negative.   Psychiatric/Behavioral: Negative.        Objective:   Physical Exam  Constitutional: She is oriented to person, place, and time.  Neck: Normal range of motion. Neck supple.  Cardiovascular: Normal rate, regular rhythm, normal heart sounds and intact distal pulses.  Pulmonary/Chest: Effort normal and breath sounds normal.  Abdominal: Soft. Bowel sounds are normal.  Neurological: She is alert and oriented to person, place, and time. She has normal reflexes.  Skin: Skin is warm and dry.  Psychiatric: She has a normal mood and affect. Her behavior is normal. Judgment and thought content normal.     BP (!) 119/52 (BP Location: Left Arm, Patient Position: Sitting, Cuff Size: Normal)   Pulse (!) 101   Temp 97.9 F (36.6 C) (Oral)   Resp 16   Ht 5\' 1"  (1.549 m)   Wt 113 lb (51.3 kg)   LMP 02/01/2017   SpO2 100%   BMI 21.35 kg/m  Assessment & Plan:  1. Possible exposure to STD Recommend barrier protection with sexual intercourse.  Given latex condoms.  Discussed the importance of barrier protection at length - Cervicovaginal ancillary only - RPR - HIV antibody (with reflex) - Urine Culture  2. Dysuria - Cervicovaginal ancillary only - Urine Culture   RTC: We will follow-up by phone with any abnormal lab results   Nolon NationsLaChina Moore Shakayla Hickox  MSN, FNP-C [facility] [address] [place], [place] [address] [phone]

## 2017-03-09 NOTE — Patient Instructions (Signed)
We will follow-up by phone with any abnormal lab results

## 2017-03-10 LAB — CERVICOVAGINAL ANCILLARY ONLY
BACTERIAL VAGINITIS: POSITIVE — AB
CHLAMYDIA, DNA PROBE: NEGATIVE
Candida vaginitis: POSITIVE — AB
NEISSERIA GONORRHEA: NEGATIVE
Trichomonas: NEGATIVE

## 2017-03-10 LAB — POCT URINALYSIS DIP (DEVICE)
Bilirubin Urine: NEGATIVE
Glucose, UA: NEGATIVE mg/dL
HGB URINE DIPSTICK: NEGATIVE
KETONES UR: NEGATIVE mg/dL
Nitrite: NEGATIVE
PROTEIN: NEGATIVE mg/dL
SPECIFIC GRAVITY, URINE: 1.015 (ref 1.005–1.030)
UROBILINOGEN UA: 4 mg/dL — AB (ref 0.0–1.0)
pH: 7.5 (ref 5.0–8.0)

## 2017-03-10 LAB — URINE CULTURE
MICRO NUMBER: 81342203
Result:: NO GROWTH
SPECIMEN QUALITY: ADEQUATE

## 2017-03-10 LAB — HIV ANTIBODY (ROUTINE TESTING W REFLEX): HIV: NONREACTIVE

## 2017-03-10 LAB — RPR: RPR: NONREACTIVE

## 2017-03-14 ENCOUNTER — Other Ambulatory Visit: Payer: Self-pay | Admitting: Family Medicine

## 2017-03-14 DIAGNOSIS — B3731 Acute candidiasis of vulva and vagina: Secondary | ICD-10-CM

## 2017-03-14 DIAGNOSIS — N760 Acute vaginitis: Principal | ICD-10-CM

## 2017-03-14 DIAGNOSIS — B373 Candidiasis of vulva and vagina: Secondary | ICD-10-CM

## 2017-03-14 DIAGNOSIS — B9689 Other specified bacterial agents as the cause of diseases classified elsewhere: Secondary | ICD-10-CM

## 2017-03-14 MED ORDER — FLUCONAZOLE 150 MG PO TABS: 150.0000 mg | ORAL_TABLET | Freq: Every day | ORAL | 0 refills | Status: DC

## 2017-03-14 MED ORDER — METRONIDAZOLE 500 MG PO TABS: 500.0000 mg | ORAL_TABLET | Freq: Two times a day (BID) | ORAL | 0 refills | Status: DC

## 2017-03-14 NOTE — Progress Notes (Signed)
Meds ordered this encounter  Medications  . metroNIDAZOLE (FLAGYL) 500 MG tablet    Sig: Take 1 tablet (500 mg total) by mouth 2 (two) times daily.    Dispense:  14 tablet    Refill:  0  . fluconazole (DIFLUCAN) 150 MG tablet    Sig: Take 1 tablet (150 mg total) by mouth daily.    Dispense:  1 tablet    Refill:  0    [name]  MSN, FNP-C Patient [facility] [address]  [place], [place] [address] [phone]

## 2017-03-15 ENCOUNTER — Telehealth: Payer: Self-pay

## 2017-03-15 NOTE — Telephone Encounter (Signed)
Called, patient's mother answered phone. Patient was not available, mom states she will have her call back when she is out of class. Thanks!

## 2017-03-15 NOTE — Telephone Encounter (Signed)
-----   Message from Massie MaroonLachina M Hollis, [place] sent at 03/14/2017  5:03 PM EST ----- Regarding: lab resutls Please inform patient that swab yielded bacterial vaginitis and yeast.  Will start Metronidazole 500 mg BID for 7 days. Refrain from alcohol while taking metronidazole.  Will also treat yeast infection with Diflucan 150 mg times 1.   Please follow up in office as previously scheduled.   Thanks

## 2017-03-15 NOTE — Telephone Encounter (Signed)
Patient returned call. I advised of yeast infection and bacterial vaginitis. Advised to take Metronidazole 500mg  bid for 7 days for the bv and diflucan 150mg  as a single dose for the yeast. Patient verbalized understanding and was encouraged to keep next scheduled appointment. Thanks!

## 2017-04-20 ENCOUNTER — Ambulatory Visit: Payer: BLUE CROSS/BLUE SHIELD | Admitting: Family Medicine

## 2017-05-26 ENCOUNTER — Ambulatory Visit: Payer: BLUE CROSS/BLUE SHIELD | Admitting: Family Medicine

## 2017-05-26 ENCOUNTER — Encounter: Payer: Self-pay | Admitting: Family Medicine

## 2017-05-26 VITALS — BP 114/60 | HR 98 | Temp 98.4°F | Resp 14 | Ht 61.0 in | Wt 116.0 lb

## 2017-05-26 DIAGNOSIS — R05 Cough: Secondary | ICD-10-CM

## 2017-05-26 DIAGNOSIS — E559 Vitamin D deficiency, unspecified: Secondary | ICD-10-CM

## 2017-05-26 DIAGNOSIS — R5383 Other fatigue: Secondary | ICD-10-CM | POA: Diagnosis not present

## 2017-05-26 DIAGNOSIS — J029 Acute pharyngitis, unspecified: Secondary | ICD-10-CM | POA: Diagnosis not present

## 2017-05-26 DIAGNOSIS — R059 Cough, unspecified: Secondary | ICD-10-CM

## 2017-05-26 LAB — POCT RAPID STREP A (OFFICE): Rapid Strep A Screen: NEGATIVE

## 2017-05-26 MED ORDER — GUAIFENESIN-CODEINE 100-10 MG/5ML PO SYRP: 5.0000 mL | ORAL_SOLUTION | Freq: Three times a day (TID) | ORAL | 0 refills | Status: AC | PRN

## 2017-05-26 NOTE — Patient Instructions (Addendum)
Your test was negative for strep throat. Recommend warm salt water gargles as needed. For persistent cough, will start Guai-tussin 5 ml every 8 hours as needed for persistent cough. Increase rest, handwashing, and vitamin C intake.   Will follow up by phone with any abnormal lab results.    Sore Throat When you have a sore throat, your throat may:  Hurt.  Burn.  Feel irritated.  Feel scratchy.  Many things can cause a sore throat, including:  An infection.  Allergies.  Dryness in the air.  Smoke or pollution.  Gastroesophageal reflux disease (GERD).  A tumor.  A sore throat can be the first sign of another sickness. It can happen with other problems, like coughing or a fever. Most sore throats go away without treatment. Follow these instructions at home:  Take over-the-counter medicines only as told by your doctor.  Drink enough fluids to keep your pee (urine) clear or pale yellow.  Rest when you feel you need to.  To help with pain, try: ? Sipping warm liquids, such as broth, herbal tea, or warm water. ? Eating or drinking cold or frozen liquids, such as frozen ice pops. ? Gargling with a salt-water mixture 3-4 times a day or as needed. To make a salt-water mixture, add -1 tsp of salt in 1 cup of warm water. Mix it until you cannot see the salt anymore. ? Sucking on hard candy or throat lozenges. ? Putting a cool-mist humidifier in your bedroom at night. ? Sitting in the bathroom with the door closed for 5-10 minutes while you run hot water in the shower.  Do not use any tobacco products, such as cigarettes, chewing tobacco, and e-cigarettes. If you need help quitting, ask your doctor. Contact a doctor if:  You have a fever for more than 2-3 days.  You keep having symptoms for more than 2-3 days.  Your throat does not get better in 7 days.  You have a fever and your symptoms suddenly get worse. Get help right away if:  You have trouble breathing.  You  cannot swallow fluids, soft foods, or your saliva.  You have swelling in your throat or neck that gets worse.  You keep feeling like you are going to throw up (vomit).  You keep throwing up. This information is not intended to replace advice given to you by your health care provider. Make sure you discuss any questions you have with your health care provider. Document Released: 01/05/2008 Document Revised: 11/22/2015 Document Reviewed: 01/16/2015 Elsevier Interactive Patient Education  [employer]2018 Elsevier Inc.

## 2017-05-26 NOTE — Progress Notes (Signed)
Subjective:     Ashley HoopsJasmine Herman is a 22 y.o. female here for evaluation of a cough and sore throat. Onset of symptoms was 3 days ago.  Patient has not attempted any OTC interventions to improve symptoms. The cough is dry, hoarse and nonproductive and is aggravated by reclining position. Associated symptoms include: sore throat. Patient does not have a history of asthma. Patient does have a history of environmental allergens. Patient has not  traveled recently. Patient does not have a history of smoking. Leavy CellaJasmine says that it has been difficult to swallow.  Patient also has a history of iron deficiency anemia and vitamin D deficiency. She says that she has not been taking medications consistently over the past month.  Past Medical History:  Diagnosis Date  . Anemia    Social History   Socioeconomic History  . Marital status: Single    Spouse name: Not on file  . Number of children: Not on file  . Years of education: Not on file  . Highest education level: Not on file  Social Needs  . Financial resource strain: Not on file  . Food insecurity - worry: Not on file  . Food insecurity - inability: Not on file  . Transportation needs - medical: Not on file  . Transportation needs - non-medical: Not on file  Occupational History  . Not on file  Tobacco Use  . Smoking status: Never Smoker  . Smokeless tobacco: Never Used  Substance and Sexual Activity  . Alcohol use: No  . Drug use: No  . Sexual activity: Yes    Birth control/protection: Condom  Other Topics Concern  . Not on file  Social History Narrative  . Not on file    There is no immunization history on file for this patient. Review of Systems  Constitutional: Positive for malaise/fatigue.  HENT: Positive for sore throat.   Eyes: Negative.   Respiratory: Positive for cough.   Gastrointestinal: Negative.   Genitourinary: Negative.   Musculoskeletal: Negative.   Skin: Negative.   Neurological: Negative.     Endo/Heme/Allergies: Negative.   Psychiatric/Behavioral: Negative.     Objective:  Physical Exam  HENT:  Mouth/Throat: Oropharyngeal exudate and posterior oropharyngeal edema present.    Eyes: Pupils are equal, round, and reactive to light.  Cardiovascular: Normal rate, regular rhythm and normal heart sounds.  Pulmonary/Chest: Effort normal and breath sounds normal.  Abdominal: Soft. Bowel sounds are normal.   Assessment:    Cough    Plan:  Cough Increase rest, handwashing, and fluid intake.  - guaiFENesin-codeine (ROBITUSSIN AC) 100-10 MG/5ML syrup; Take 5 mLs by mouth 3 (three) times daily as needed for up to 5 days for cough.  Dispense: 120 mL; Refill: 0  2. Sore throat Recommend Tylenol 500 mg every 6 hours as needed for mild to moderate sore throat pain. Also, recommend salt water gargles as needed.  - Rapid Strep A - Culture, Group A Strep  3. Other fatigue - CBC  4. Vitamin D deficiency - Vitamin D, 25-hydroxy   RTC: Will follow up by phone with any abnormal laboratory results.     The patient was given clear instructions to go to ER or return to medical center if symptoms do not improve, worsen or new problems develop. The patient verbalized understanding.      Nolon NationsLachina Moore Hilari Wethington  MSN, FNP-C Patient [facility] [address]  [place], [place] [address] [phone]

## 2017-05-27 LAB — CBC
HEMOGLOBIN: 11.8 g/dL (ref 11.1–15.9)
Hematocrit: 38.3 % (ref 34.0–46.6)
MCH: 25.3 pg — ABNORMAL LOW (ref 26.6–33.0)
MCHC: 30.8 g/dL — ABNORMAL LOW (ref 31.5–35.7)
MCV: 82 fL (ref 79–97)
Platelets: 454 10*3/uL — ABNORMAL HIGH (ref 150–379)
RBC: 4.66 x10E6/uL (ref 3.77–5.28)
RDW: 15.2 % (ref 12.3–15.4)
WBC: 5.4 10*3/uL (ref 3.4–10.8)

## 2017-05-27 LAB — VITAMIN D 25 HYDROXY (VIT D DEFICIENCY, FRACTURES): VIT D 25 HYDROXY: 21.8 ng/mL — AB (ref 30.0–100.0)

## 2017-05-29 LAB — CULTURE, GROUP A STREP: Strep A Culture: NEGATIVE

## 2018-11-17 IMAGING — DX DG ABDOMEN 2V
2 series · 2 of 2 positions shown · non-contrast
Comparison: No prior .

CLINICAL DATA: Pain after eating.

EXAM:
ABDOMEN - 2 VIEW

[abdomen erect]
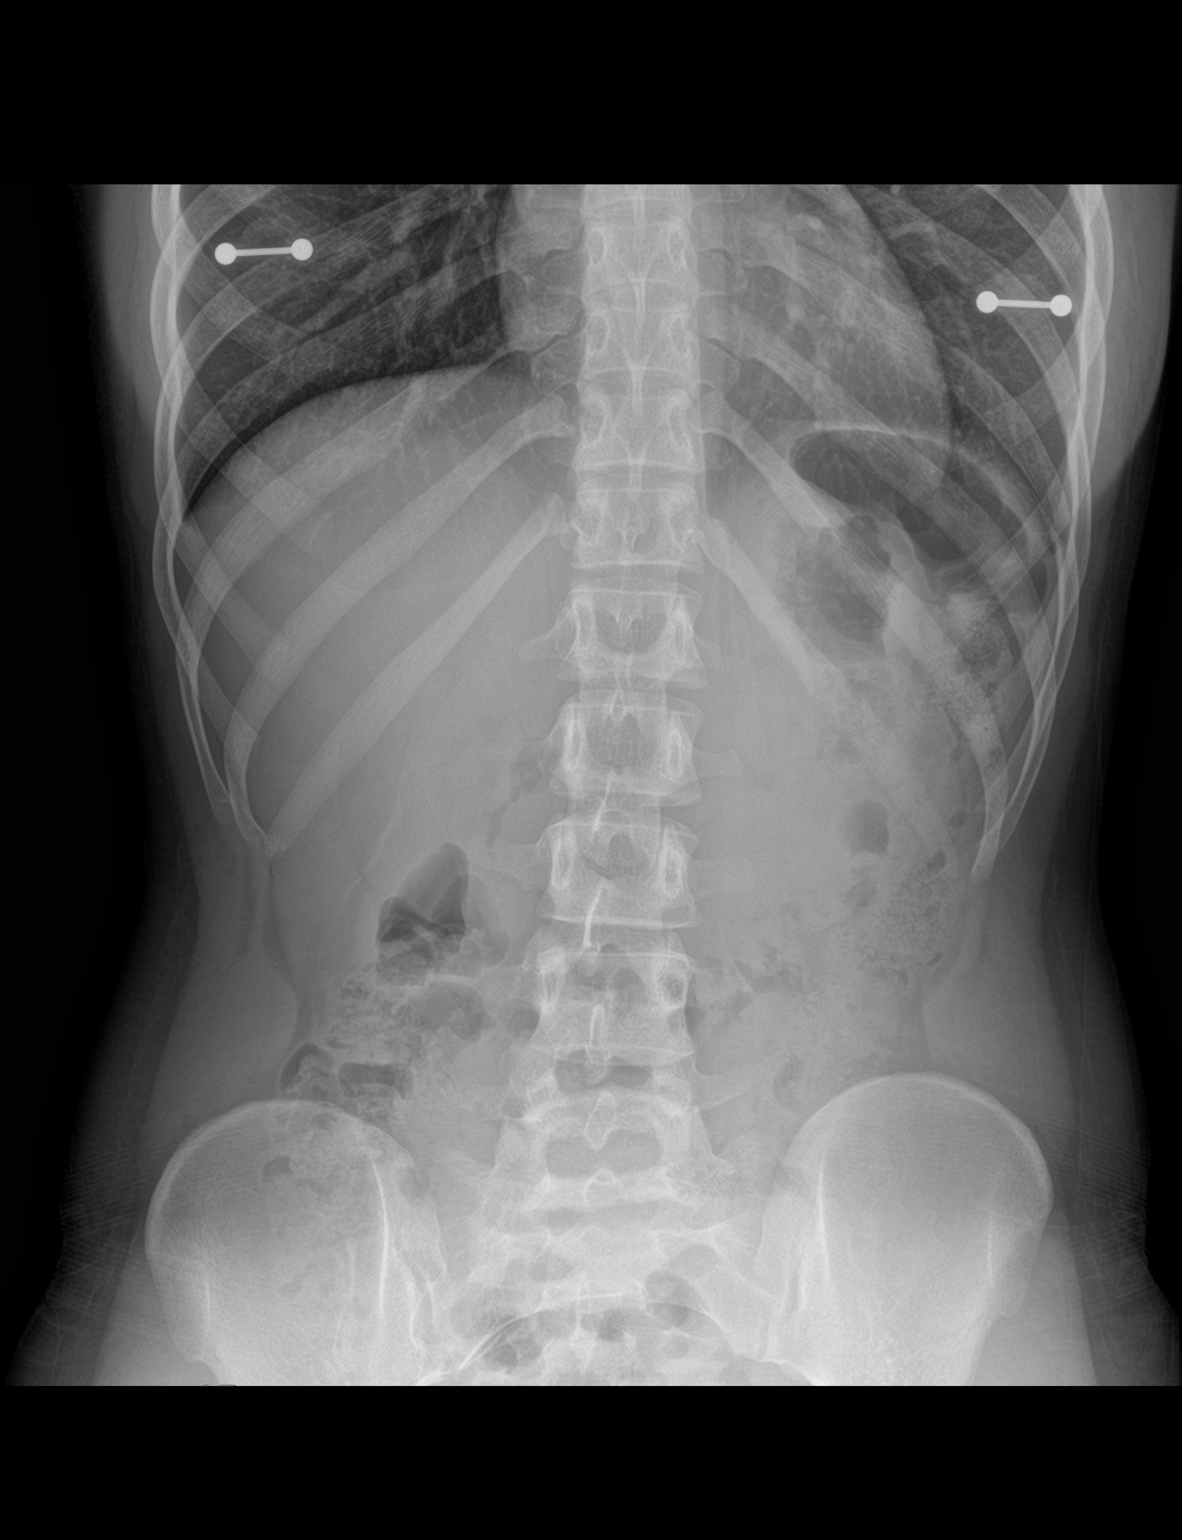

[abdomen supine]
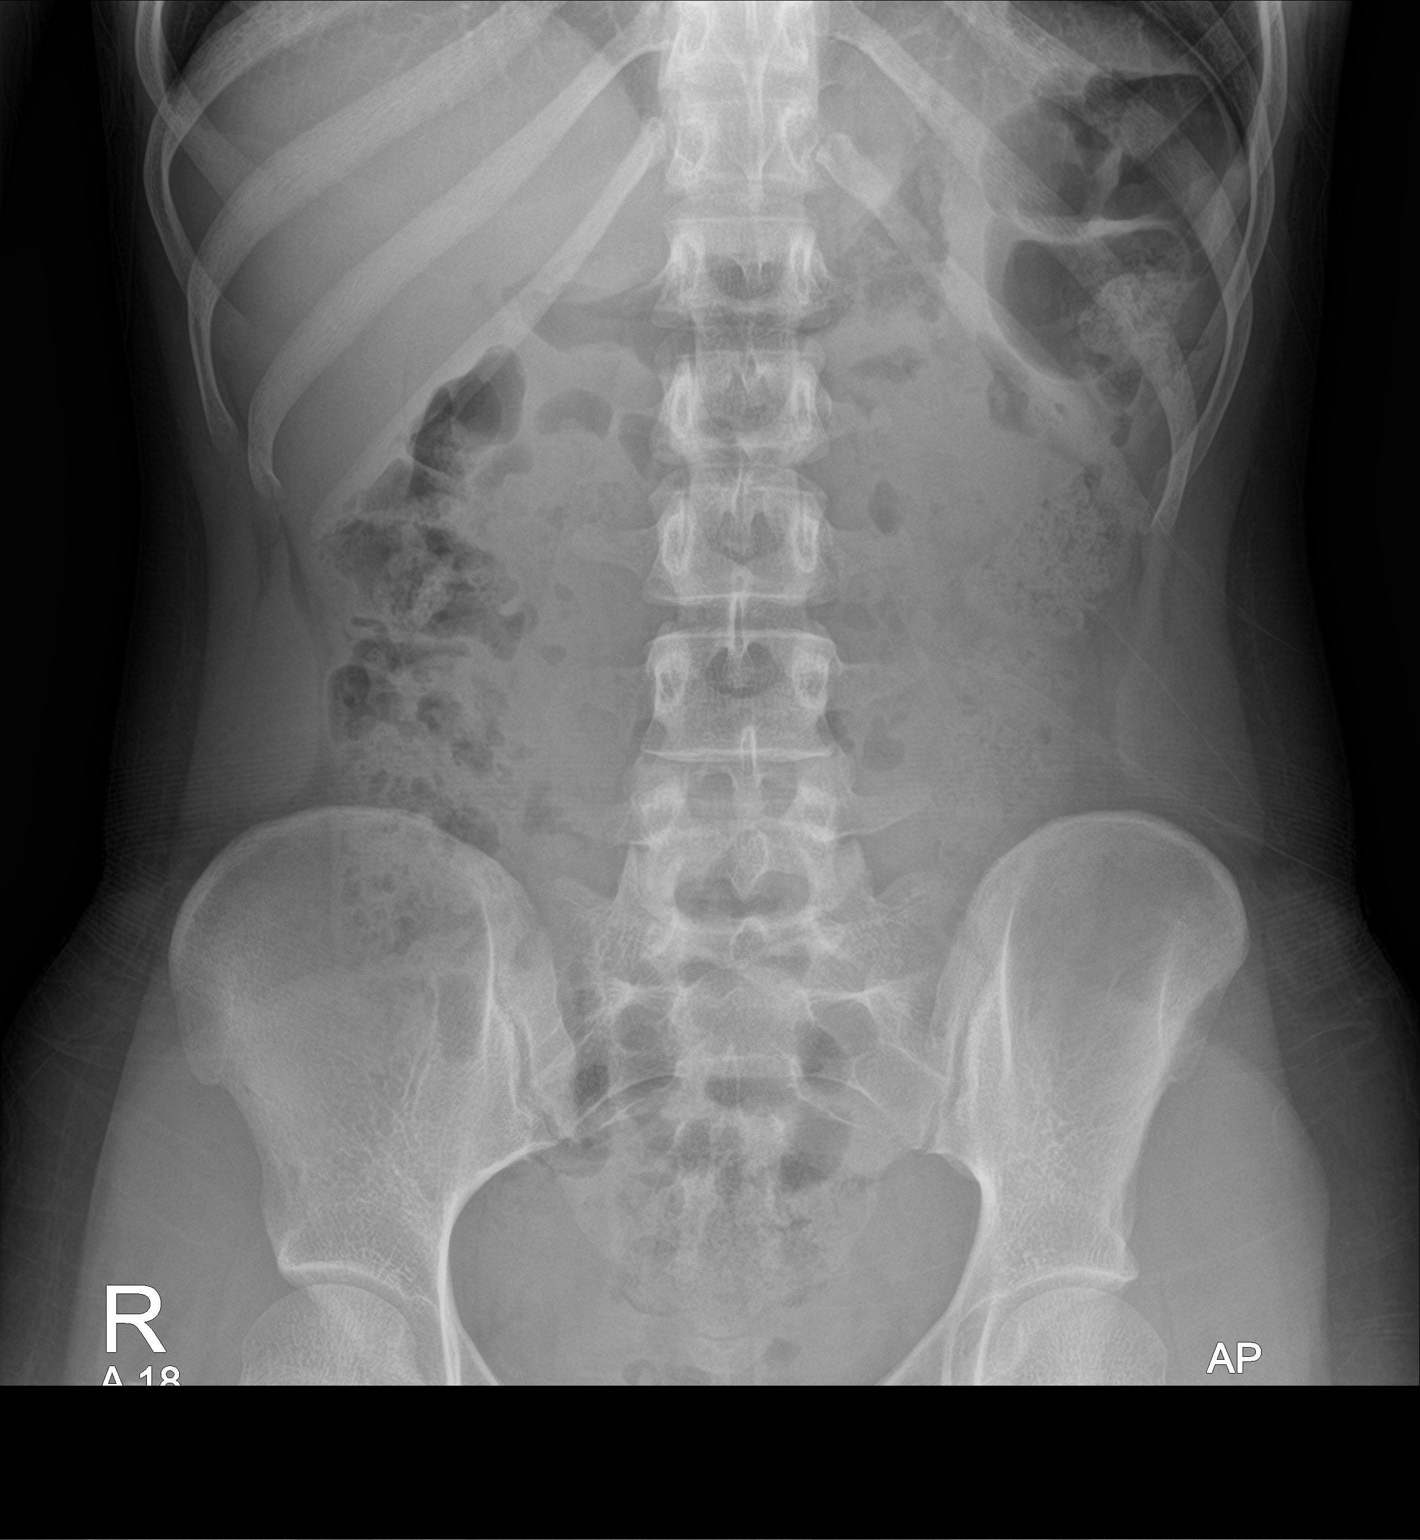

[2 of 2 positions shown; findings below may reference images not displayed]

FINDINGS: Soft tissue structures are unremarkable. No bowel distention. Stool
noted throughout colon. No free air. No acute bony abnormality .
IMPRESSION: No acute abnormality.

## 2018-11-18 DIAGNOSIS — I471 Supraventricular tachycardia, unspecified: Secondary | ICD-10-CM | POA: Insufficient documentation

## 2018-11-23 IMAGING — CT CT ABD-PELV W/ CM
2 of 4 series · 16 of 46 positions shown, 18 images · IV contrast (APPLIED)
Comparison: Abdominal radiographs January 26, 2017

CLINICAL DATA: RIGHT groin and low back pain for 3 days.

EXAM:
CT ABDOMEN AND PELVIS WITH CONTRAST
TECHNIQUE: Multidetector CT imaging of the abdomen and pelvis was performed
using the standard protocol following bolus administration of
intravenous contrast.
CONTRAST:  100mL SOTPOT-733 IOPAMIDOL (SOTPOT-733) INJECTION 61%

[Series 3: abd/ pelvis 5.0 i30f 2 · axial · 0.67mm/px · z∈[+812,+1182]mm · 13 of 82 slices shown, 15 images]
[im 4/82  soft-tissue]
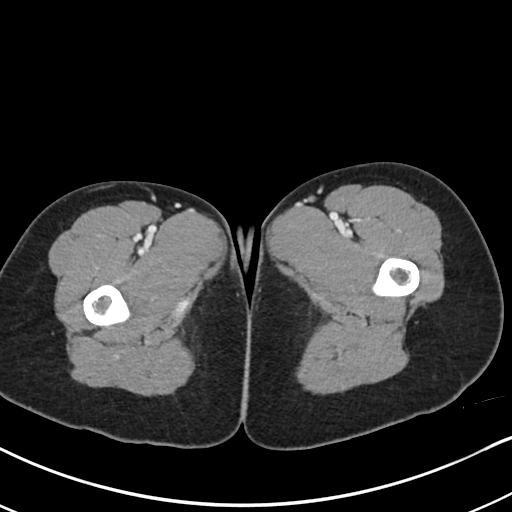
[im 4/82  bone]
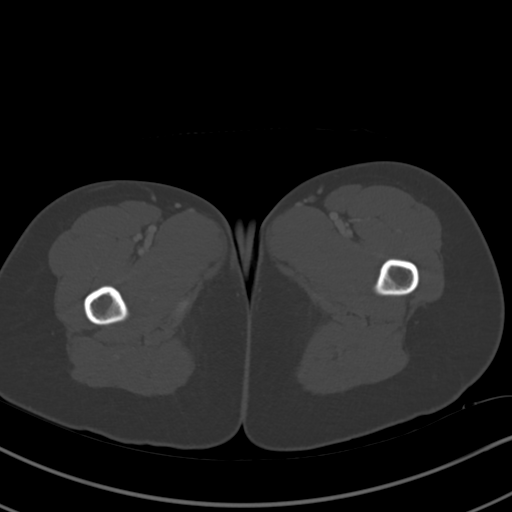
[im 12/82  soft-tissue]
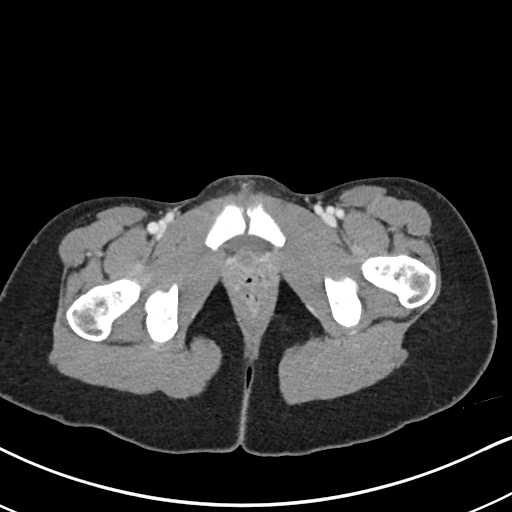
[im 19/82  soft-tissue]
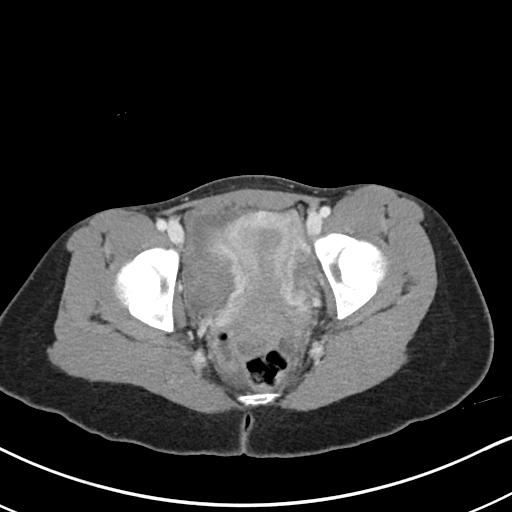
[im 23/82  soft-tissue]
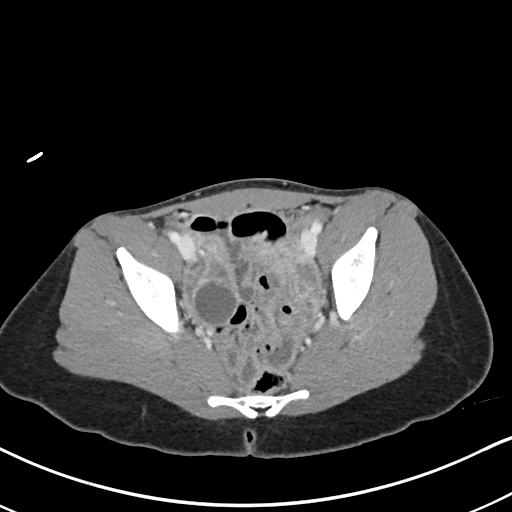
[im 30/82  soft-tissue]
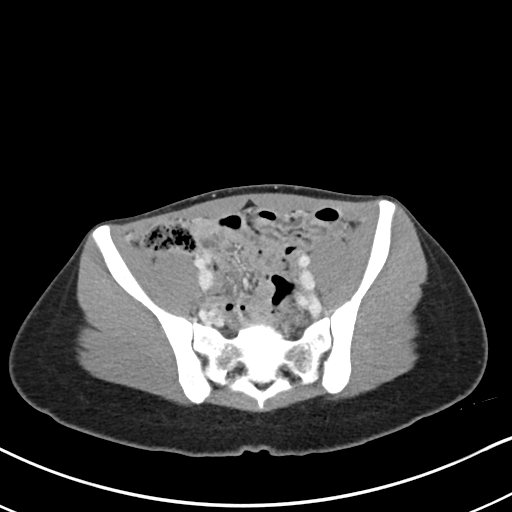
[im 34/82  soft-tissue]
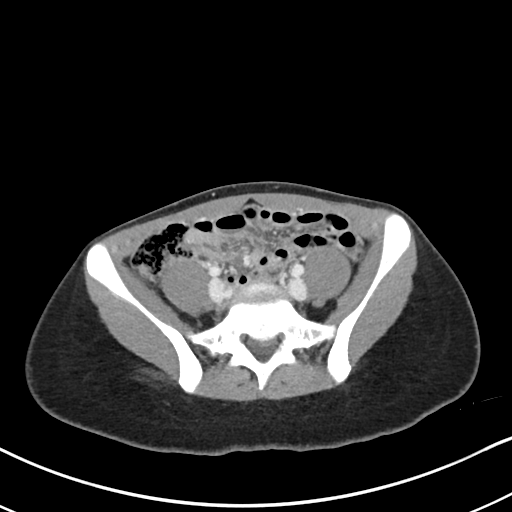
[im 41/82  soft-tissue]
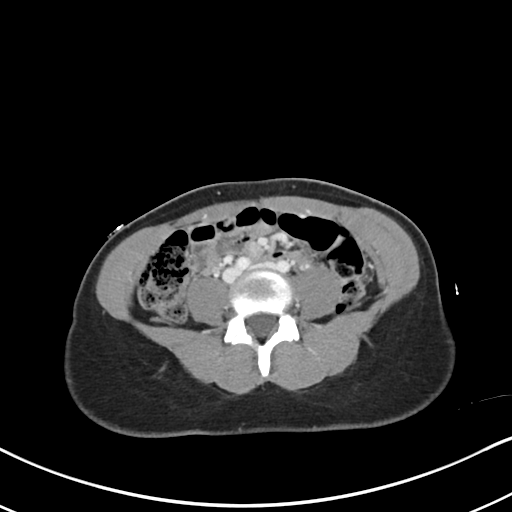
[im 48/82  soft-tissue]
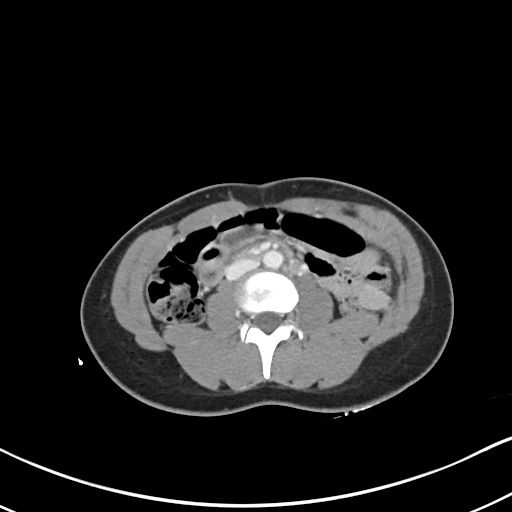
[im 52/82  soft-tissue]
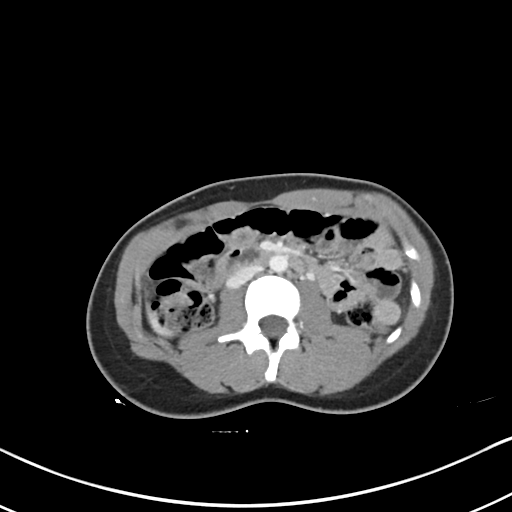
[im 52/82  bone]
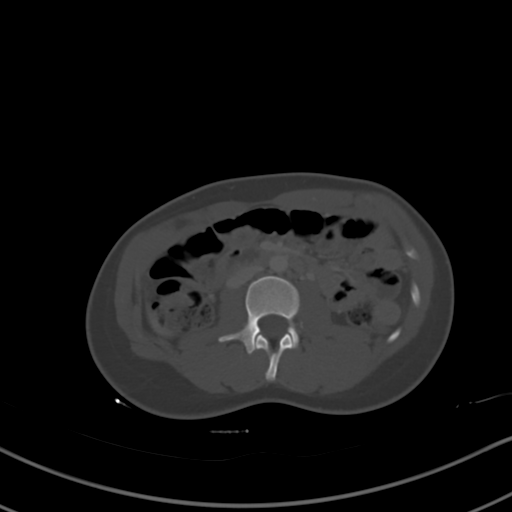
[im 59/82  soft-tissue]
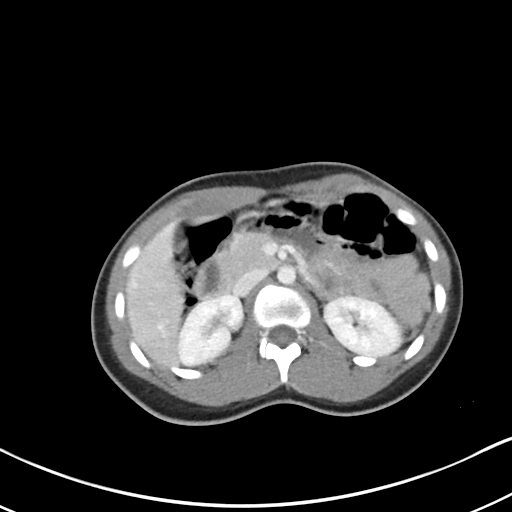
[im 63/82  soft-tissue]
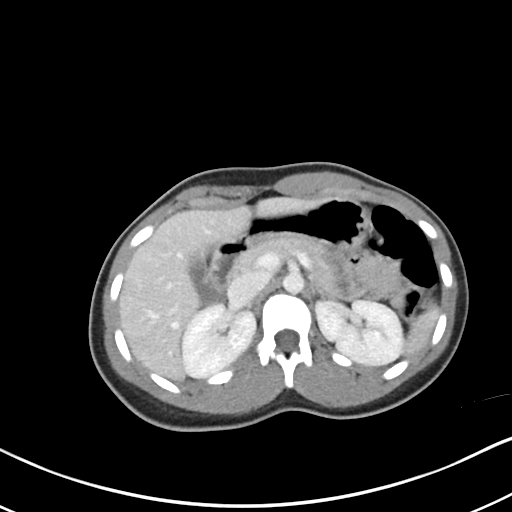
[im 70/82  soft-tissue]
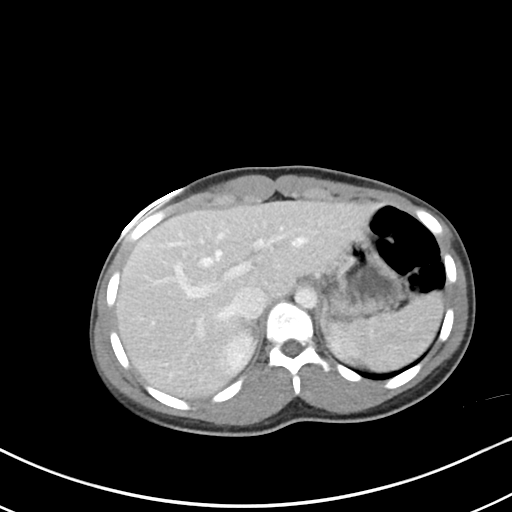
[im 78/82  soft-tissue]
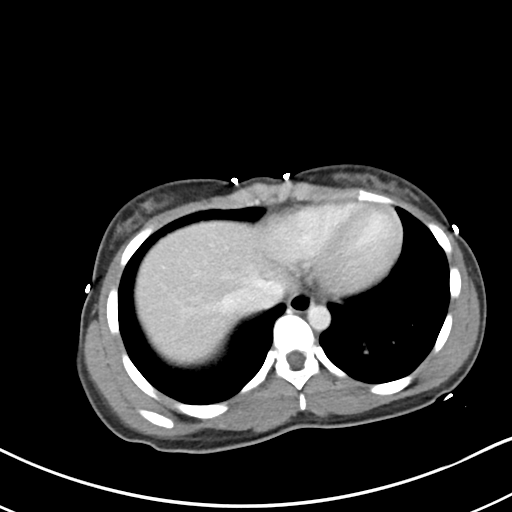

[Series 6: coronal soft tissue · coronal · 0.67mm/px · 3 of 61 slices shown]
[im 21/61  soft-tissue]
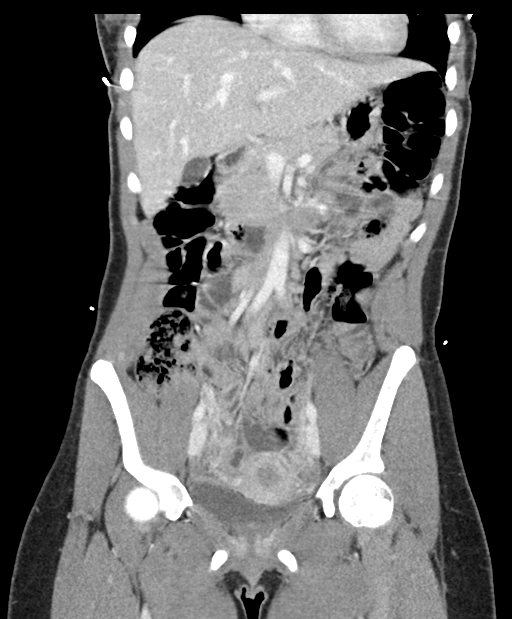
[im 27/61  soft-tissue]
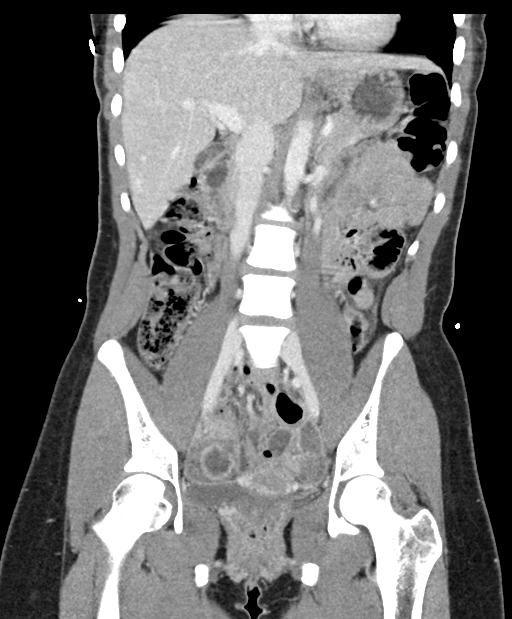
[im 34/61  soft-tissue]
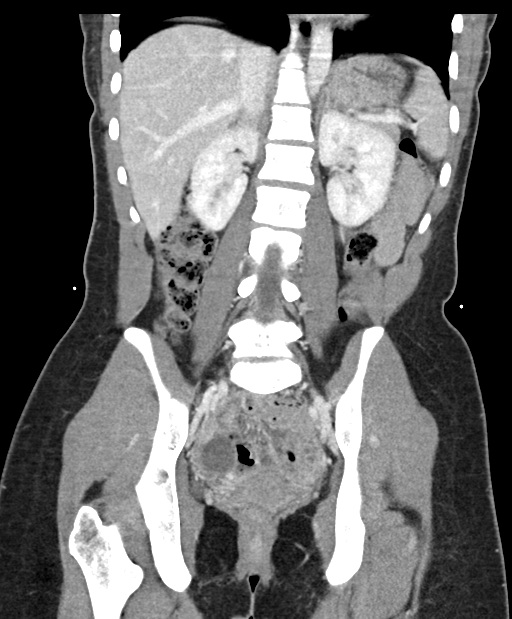

[16 of 46 positions shown; findings below may reference images not displayed]

FINDINGS: LOWER CHEST: Lung bases are clear. Included heart size is normal. No
pericardial effusion.

HEPATOBILIARY: Liver and gallbladder are normal.

PANCREAS: Normal.

SPLEEN: Normal.

ADRENALS/URINARY TRACT: Kidneys are orthotopic, demonstrating
symmetric enhancement. No nephrolithiasis, hydronephrosis or solid
renal masses. The unopacified ureters are normal in course and
caliber. Urinary bladder is partially distended and unremarkable.
Normal adrenal glands.

STOMACH/BOWEL: The stomach, small and large bowel are normal in
course and caliber without inflammatory changes, sensitivity
decreased without oral contrast. Identifiable segments of the
appendix are gas-filled, nonacute.

VASCULAR/LYMPHATIC: Aortoiliac vessels are normal in course and
caliber. No lymphadenopathy by CT size criteria.

REPRODUCTIVE: 3.2 cm benign-appearing RIGHT adnexal cyst.

OTHER: Small amount of free fluid in the pelvis this is likely
physiologic. Subcentimeter cyst in the vagina most compatible with
Djuneit cyst.

MUSCULOSKELETAL: Nonacute.
IMPRESSION: 1. 3.2 cm benign-appearing RIGHT adnexal cyst. No routine indicated
follow-up. This recommendation follows ACR consensus guidelines:
White Paper of the ACR Incidental Findings Committee II on Adnexal
Findings. [HOSPITAL] [DATE].

## 2020-05-26 DIAGNOSIS — Z9889 Other specified postprocedural states: Secondary | ICD-10-CM

## 2020-05-26 HISTORY — DX: Other specified postprocedural states: Z98.890

## 2022-04-17 ENCOUNTER — Emergency Department (HOSPITAL_COMMUNITY)
Admission: EM | Admit: 2022-04-17 | Discharge: 2022-04-18 | Disposition: A | Discharge status: Home / Self Care | Payer: Medicaid Other | Attending: Emergency Medicine | Admitting: Emergency Medicine

## 2022-04-17 ENCOUNTER — Encounter (HOSPITAL_COMMUNITY): Payer: Self-pay | Admitting: Pharmacy Technician

## 2022-04-17 ENCOUNTER — Emergency Department (HOSPITAL_COMMUNITY): Payer: Medicaid Other

## 2022-04-17 DIAGNOSIS — M25512 Pain in left shoulder: Secondary | ICD-10-CM | POA: Insufficient documentation

## 2022-04-17 DIAGNOSIS — R079 Chest pain, unspecified: Secondary | ICD-10-CM | POA: Insufficient documentation

## 2022-04-17 DIAGNOSIS — R002 Palpitations: Secondary | ICD-10-CM | POA: Insufficient documentation

## 2022-04-17 LAB — BASIC METABOLIC PANEL
Anion gap: 8 (ref 5–15)
BUN: 11 mg/dL (ref 6–20)
CO2: 25 mmol/L (ref 22–32)
Calcium: 9 mg/dL (ref 8.9–10.3)
Chloride: 102 mmol/L (ref 98–111)
Creatinine, Ser: 0.82 mg/dL (ref 0.44–1.00)
GFR, Estimated: >60 mL/min (ref >60)
Glucose, Bld: 109 mg/dL — ABNORMAL HIGH (ref 70–99)
Potassium: 3.9 mmol/L (ref 3.5–5.1)
Sodium: 135 mmol/L (ref 135–145)

## 2022-04-17 LAB — CBC WITH DIFFERENTIAL/PLATELET
Abs Immature Granulocytes: 0.02 10*3/uL (ref 0.00–0.07)
Basophils Absolute: 0.1 10*3/uL (ref 0.0–0.1)
Basophils Relative: 1 %
Eosinophils Absolute: 0.3 10*3/uL (ref 0.0–0.5)
Eosinophils Relative: 3 %
HCT: 44.3 % (ref 36.0–46.0)
Hemoglobin: 13.7 g/dL (ref 12.0–15.0)
Immature Granulocytes: 0 %
Lymphocytes Relative: 28 %
Lymphs Abs: 2.1 10*3/uL (ref 0.7–4.0)
MCH: 28.1 pg (ref 26.0–34.0)
MCHC: 30.9 g/dL (ref 30.0–36.0)
MCV: 90.8 fL (ref 80.0–100.0)
Monocytes Absolute: 0.8 10*3/uL (ref 0.1–1.0)
Monocytes Relative: 10 %
Neutro Abs: 4.3 10*3/uL (ref 1.7–7.7)
Neutrophils Relative %: 58 %
Platelets: 425 10*3/uL — ABNORMAL HIGH (ref 150–400)
RBC: 4.88 MIL/uL (ref 3.87–5.11)
RDW: 13.3 % (ref 11.5–15.5)
WBC: 7.6 10*3/uL (ref 4.0–10.5)
nRBC: 0 % (ref 0.0–0.2)

## 2022-04-17 LAB — TROPONIN I (HIGH SENSITIVITY)
Troponin I (High Sensitivity): 3 ng/L (ref <18)
Troponin I (High Sensitivity): 3 ng/L (ref <18)

## 2022-04-17 NOTE — ED Provider Notes (Signed)
  [facility] EMERGENCY DEPARTMENT Provider Note   CSN: [id number] Arrival date & time: 04/17/22  1805     History {Add pertinent medical, surgical, social history, OB history to HPI:1} Chief Complaint  Patient presents with   Chest Pain    Ashley Herman is a 27 y.o. female.  HPI     Home Medications Prior to Admission medications   Medication Sig Start Date End Date Taking? Authorizing Provider  ergocalciferol (DRISDOL) 50000 units capsule Take 1 capsule (50,000 Units total) by mouth once a week. Patient not taking: Reported on 05/26/2017 03/07/17   Dorena Dew, FNP  ferrous sulfate 325 (65 FE) MG tablet Take 1 tablet (325 mg total) by mouth daily. Patient not taking: Reported on 05/26/2017 03/07/17 03/07/18  Dorena Dew, FNP      Allergies    Patient has no known allergies.    Review of Systems   Review of Systems  Physical Exam Updated Vital Signs BP 130/71   Pulse 82   Temp 98.1 F (36.7 C) (Oral)   Resp (!) 22   SpO2 100%  Physical Exam  ED Results / Procedures / Treatments   Labs (all labs ordered are listed, but only abnormal results are displayed) Labs Reviewed  BASIC METABOLIC PANEL - Abnormal; Notable for the following components:      Result Value   Glucose, Bld 109 (*)    All other components within normal limits  CBC WITH DIFFERENTIAL/PLATELET - Abnormal; Notable for the following components:   Platelets 425 (*)    All other components within normal limits  TROPONIN I (HIGH SENSITIVITY)  TROPONIN I (HIGH SENSITIVITY)    EKG EKG Interpretation  Date/Time:  Sunday April 17 2022 18:13:56 EST Ventricular Rate:  85 PR Interval:  148 QRS Duration: 70 QT Interval:  352 QTC Calculation: 418 R Axis:   86 Text Interpretation: Normal sinus rhythm with sinus arrhythmia Nonspecific T wave abnormality Abnormal ECG No old tracing to compare Confirmed by Deno Etienne [phone]) on 04/17/2022 10:13:03 PM  Radiology DG Chest 2  View  Result Date: 04/17/2022 CLINICAL DATA:  Left chest and arm pain and shortness of breath. EXAM: CHEST - 2 VIEW COMPARISON:  None Available. FINDINGS: The cardiac silhouette is mildly enlarged. The lungs are mildly hypoinflated. No airspace consolidation, edema, pleural effusion, or pneumothorax is identified. No acute osseous abnormality is seen. IMPRESSION: No active cardiopulmonary disease. Electronically Signed   By: Logan Bores M.D.   On: 04/17/2022 19:16    Procedures Procedures  {Document cardiac monitor, telemetry assessment procedure when appropriate:1}  Medications Ordered in ED Medications - No data to display  ED Course/ Medical Decision Making/ A&P                           Medical Decision Making  ***  {Document critical care time when appropriate:1} {Document review of labs and clinical decision tools ie heart score, Chads2Vasc2 etc:1}  {Document your independent review of radiology images, and any outside records:1} {Document your discussion with family members, caretakers, and with consultants:1} {Document social determinants of health affecting pt's care:1} {Document your decision making why or why not admission, treatments were needed:1} Final Clinical Impression(s) / ED Diagnoses Final diagnoses:  None    Rx / DC Orders ED Discharge Orders     None

## 2022-04-17 NOTE — ED Provider Triage Note (Signed)
Emergency Medicine Provider Triage Evaluation Note  Ashley Herman , a 27 y.o. female  was evaluated in triage.  Pt complains of racing heart and chest discomfort earlier this morning.  Hx SVT, had ablation at that time heart rate was around 190 bpm.  Unsure of how fast her heart rate was this morning.  Still with some discomfort.  Family history of thyroid problems.  No early cardiac family history.  Denies fever or recent URI symptoms.  Review of Systems  Positive:  Negative: See above  Physical Exam  BP 123/83 (BP Location: Right Arm)   Pulse 79   Temp 98.4 F (36.9 C) (Oral)   Resp 17   SpO2 98%  Gen:   Awake, no distress   Resp:  Normal effort, CTAB MSK:   Moves extremities without difficulty  Other:  No cardiac murmur appreciated.  Chest non-TTP, no crepitus.  Medical Decision Making  Medically screening exam initiated at 6:14 PM.  Appropriate orders placed.  Ashley Herman was informed that the remainder of the evaluation will be completed by another provider, this initial triage assessment does not replace that evaluation, and the importance of remaining in the ED until their evaluation is complete.     Ashley Rome, PA-C 33/38/32 1818

## 2022-04-17 NOTE — ED Triage Notes (Signed)
Pt here with reports of feeling like her heart was racing at 0500 this morning. Pt with hx of ablation. Took tylenol without relief. Endorses shob with exertion.

## 2022-04-18 MED ORDER — CYCLOBENZAPRINE HCL 10 MG PO TABS: 10.0000 mg | ORAL_TABLET | Freq: Two times a day (BID) | ORAL | 0 refills | Status: DC | PRN

## 2022-04-18 MED ORDER — CYCLOBENZAPRINE HCL 10 MG PO TABS
5.0000 mg | ORAL_TABLET | Freq: Once | ORAL | Status: AC
  Administered 2022-04-18: 5 mg via ORAL
  Filled 2022-04-18: qty 1

## 2022-04-18 NOTE — Discharge Instructions (Signed)
Lab work imaging all reassuring, please abstain from excessive amount of caffeine use, try to decrease in stress, get plenty of sleep, stay hydrated, I have given you a  muscle relaxer please take as prescribed this can help with muscle pain, this medication make you drowsy do not consume alcohol or operate heavy machinery while taking this medication.  I have placed a referred to  cardiologist they should contact you in the next couple days for a follow-up.  Come back to the emergency department if you develop chest pain, shortness of breath, severe abdominal pain, uncontrolled nausea, vomiting, diarrhea.

## 2022-04-21 ENCOUNTER — Ambulatory Visit: Payer: Medicaid Other | Admitting: Cardiology

## 2022-04-21 NOTE — Progress Notes (Incomplete)
Primary Care Provider: Pcp, No [facility] Cardiologist: None Electrophysiologist: None  Clinic Note: No chief complaint on file.   ===================================  ASSESSMENT/PLAN   Problem List Items Addressed This Visit   None   ===================================  HPI:    Ashley Herman is a 27 y.o. female who is being seen today for the evaluation of *** at the request of Marcello Fennel, [place]*.  Ashley Herman was last seen on ***  Recent Hospitalizations: ***  Reviewed  CV studies:    The following studies were reviewed today: (if available, images/films reviewed: From Epic Chart or Care Everywhere) ***:  Interval History:   Ashley Herman   CV Review of Symptoms (Summary): Cardiovascular ROS: {roscv:310661}  REVIEWED OF SYSTEMS   ROS  I have reviewed and (if needed) personally updated the patient's problem list, medications, allergies, past medical and surgical history, social and family history.   PAST MEDICAL HISTORY   Past Medical History:  Diagnosis Date  . Anemia     PAST SURGICAL HISTORY   No past surgical history on file.   There is no immunization history on file for this patient.  MEDICATIONS/ALLERGIES   No outpatient medications have been marked as taking for the 04/21/22 encounter (Appointment) with Leonie Man, MD.    No Known Allergies  SOCIAL HISTORY/FAMILY HISTORY   Reviewed in Epic:   Social History   Tobacco Use  . Smoking status: Never  . Smokeless tobacco: Never  Vaping Use  . Vaping Use: Never used  Substance Use Topics  . Alcohol use: No  . Drug use: No   Social History   Social History Narrative  . Not on file   No family history on file.  OBJCTIVE -PE, EKG, labs   Wt Readings from Last 3 Encounters:  05/26/17 116 lb (52.6 kg)  03/09/17 113 lb (51.3 kg)  03/01/17 112 lb (50.8 kg)    Physical Exam: There were no vitals taken for this visit. Physical Exam   Adult ECG  Report  Rate: *** ;  Rhythm: {rhythm:17366};   Narrative Interpretation: ***  Recent Labs:  ***  No results found for: "CHOL", "HDL", "LDLCALC", "LDLDIRECT", "TRIG", "CHOLHDL" Lab Results  Component Value Date   CREATININE 0.82 04/17/2022   BUN 11 04/17/2022   NA 135 04/17/2022   K 3.9 04/17/2022   CL 102 04/17/2022   CO2 25 04/17/2022      Latest Ref Rng & Units 04/17/2022    6:15 PM 05/26/2017    2:07 PM 03/01/2017   11:04 AM  CBC  WBC 4.0 - 10.5 K/uL 7.6  5.4  6.1   Hemoglobin 12.0 - 15.0 g/dL 13.7  11.8  10.9   Hematocrit 36.0 - 46.0 % 44.3  38.3  33.9   Platelets 150 - 400 K/uL 425  454  532     No results found for: "HGBA1C" Lab Results  Component Value Date   TSH 0.58 03/01/2017    ================================================== I spent a total of *** minutes with the patient spent in direct patient consultation.  Additional time spent with chart review  / charting (studies, outside notes, etc): *** min Total Time: *** min  Current medicines are reviewed at length with the patient today.  (+/- concerns) ***  Notice: This dictation was prepared with Dragon dictation along with smart phrase technology. Any transcriptional errors that result from this process are unintentional and may not be corrected upon review.   Studies Ordered:  No orders of the defined types were placed in this encounter.  No orders of the defined types were placed in this encounter.   Patient Instructions / Medication Changes & Studies & Tests Ordered   There are no Patient Instructions on file for this visit.     Leonie Man, MD, MS Glenetta Hew, M.D., M.S. Interventional Cardiologist  [facility]   [address]; [place], [place]  [address] [phone]           Fax [phone]    Thank you for choosing [facility] in [place]!!

## 2022-04-22 ENCOUNTER — Ambulatory Visit: Payer: Medicaid Other | Attending: Cardiology | Admitting: Cardiology

## 2022-04-22 ENCOUNTER — Ambulatory Visit (INDEPENDENT_AMBULATORY_CARE_PROVIDER_SITE_OTHER): Payer: Medicaid Other

## 2022-04-22 ENCOUNTER — Encounter: Payer: Self-pay | Admitting: Cardiology

## 2022-04-22 VITALS — BP 102/70 | HR 84 | Ht 61.0 in | Wt 139.2 lb

## 2022-04-22 DIAGNOSIS — R072 Precordial pain: Secondary | ICD-10-CM

## 2022-04-22 DIAGNOSIS — I471 Supraventricular tachycardia, unspecified: Secondary | ICD-10-CM | POA: Diagnosis not present

## 2022-04-22 DIAGNOSIS — R002 Palpitations: Secondary | ICD-10-CM | POA: Diagnosis not present

## 2022-04-22 NOTE — Patient Instructions (Signed)
Medication Instructions:   Your physician recommends that you continue on your current medications as directed. Please refer to the Current Medication list given to you today.  *If you need a refill on your cardiac medications before your next appointment, please call your pharmacy*   Lab Work:  None Ordered  If you have labs (blood work) drawn today and your tests are completely normal, you will receive your results only by: [place] (if you have MyChart) OR A paper copy in the mail If you have any lab test that is abnormal or we need to change your treatment, we will call you to review the results.   Testing/Procedures:  Echocardiogram   Your physician has requested that you have an echocardiogram. Echocardiography is a painless test that uses sound waves to create images of your heart. It provides your doctor with information about the size and shape of your heart and how well your heart's chambers and valves are working. This procedure takes approximately one hour. There are no restrictions for this procedure. Please note; depending on visual quality an IV may need to be placed.   2. Your physician has recommended that you wear a Zio monitor.   This monitor is a medical device that records the heart's electrical activity. Doctors most often use these monitors to diagnose arrhythmias. Arrhythmias are problems with the speed or rhythm of the heartbeat. The monitor is a small device applied to your chest. You can wear one while you do your normal daily activities. While wearing this monitor if you have any symptoms to push the button and record what you felt. Once you have worn this monitor for the period of time provider prescribed (Usually 14 days), you will return the monitor device in the postage paid box. Once it is returned they will download the data collected and provide [place] with a report which the provider will then review and we will call you with those results. Important  tips:  Avoid showering during the first 24 hours of wearing the monitor. Avoid excessive sweating to help maximize wear time. Do not submerge the device, no hot tubs, and no swimming pools. Keep any lotions or oils away from the patch. After 24 hours you may shower with the patch on. Take brief showers with your back facing the shower head.  Do not remove patch once it has been placed because that will interrupt data and decrease adhesive wear time. Push the button when you have any symptoms and write down what you were feeling. Once you have completed wearing your monitor, remove and place into box which has postage paid and place in your outgoing mailbox.  If for some reason you have misplaced your box then call our office and we can provide another box and/or mail it off for you.      Follow-Up: At [facility], you and your health needs are our priority.  As part of our continuing mission to provide you with exceptional heart care, we have created designated Provider Care Teams.  These Care Teams include your primary Cardiologist (physician) and Advanced Practice Providers (APPs -  Physician Assistants and Nurse Practitioners) who all work together to provide you with the care you need, when you need it.  We recommend signing up for the patient portal called "MyChart".  Sign up information is provided on this After Visit Summary.  MyChart is used to connect with patients for Virtual Visits (Telemedicine).  Patients are able to view lab/test results,  encounter notes, upcoming appointments, etc.  Non-urgent messages can be sent to your provider as well.   To learn more about what you can do with MyChart, go to [email or web address].    Your next appointment:    8-10 weeks  Provider:   You may see Kate Sable, MD or one of the following Advanced Practice Providers on your designated Care Team:   Murray Hodgkins, NP Christell Faith, PA-C Cadence Kathlen Mody, PA-C Gerrie Nordmann,  NP

## 2022-04-22 NOTE — Progress Notes (Signed)
Cardiology Office Note:    Date:  04/22/2022   ID:  Ashley Herman, DOB Dec 29, 1995, MRN [id number]  PCP:  Aviva Kluver   [facility] Providers Cardiologist:  Debbe Odea, MD     Referring MD: Carroll Sage, PA*   Chief Complaint  Patient presents with   New Patient (Initial Visit)    Chest pain, L Arm pain, Palpitations, no Cardiac Hx    History of Present Illness:    Ashley Herman is a 27 y.o. 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  Successful ablation was performed 05/27/2020 at [facility] in [place].  She has been doing well since her ablation until 5 days ago when she suddenly had palpitations.  She drank about 2 cans of red bull 6 hours prior.  Has also been dealing with a lot of stress with significant other, some family member recently passed.  Palpitations lasted for several minutes, associated with chest soreness.  Was taken to the ED where workup was unrevealing.  Has not had much palpitations since, still has occasional left arm pain for which she takes as needed Tylenol.  She denies smoking, denies any history of heart attacks.  Past Medical History:  Diagnosis Date   Anemia    H/O prior ablation treatment 05/26/2020   [place]    History reviewed. No pertinent surgical history.  Current Medications: No outpatient medications have been marked as taking for the 04/22/22 encounter (Office Visit) with Debbe Odea, MD.     Allergies:   Patient has no known allergies.   Social History   Socioeconomic History   Marital status: Single    Spouse name: Not on file   Number of children: Not on file   Years of education: Not on file   Highest education level: Not on file  Occupational History   Not on file  Tobacco Use   Smoking status: Never    Passive exposure: Never   Smokeless tobacco:  Never  Vaping Use   Vaping Use: Never used  Substance and Sexual Activity   Alcohol use: No    Comment: occasional   Drug use: No   Sexual activity: Yes    Birth control/protection: Condom  Other Topics Concern   Not on file  Social History Narrative   Not on file   Social Determinants of Health   Financial Resource Strain: Not on file  Food Insecurity: Not on file  Transportation Needs: Not on file  Physical Activity: Not on file  Stress: Not on file  Social Connections: Not on file     Family History: The patient's family history is not on file.  ROS:   Please see the history of present illness.     All other systems reviewed and are negative.  EKGs/Labs/Other Studies Reviewed:    The following studies were reviewed today:   EKG:  EKG is  ordered today.  The ekg ordered today demonstrates normal sinus rhythm  Recent Labs: 04/17/2022: BUN 11; Creatinine, Ser 0.82; Hemoglobin 13.7; Platelets 425; Potassium 3.9; Sodium 135  Recent Lipid Panel No results found for: "CHOL", "TRIG", "HDL", "CHOLHDL", "VLDL", "LDLCALC", "LDLDIRECT"   Risk Assessment/Calculations:             Physical Exam:    VS:  BP 102/70 (BP Location: Left Arm, Patient Position: Sitting, Cuff  Size: Normal)   Pulse 84   Ht 5\' 1"  (1.549 m)   Wt 139 lb 3.2 oz (63.1 kg)   SpO2 98%   BMI 26.30 kg/m     Wt Readings from Last 3 Encounters:  04/22/22 139 lb 3.2 oz (63.1 kg)  05/26/17 116 lb (52.6 kg)  03/09/17 113 lb (51.3 kg)     GEN:  Well nourished, well developed in no acute distress HEENT: Normal NECK: No JVD; No carotid bruits CARDIAC: RRR, no murmurs, rubs, gallops RESPIRATORY:  Clear to auscultation without rales, wheezing or rhonchi  ABDOMEN: Soft, non-tender, non-distended MUSCULOSKELETAL:  No edema; No deformity  SKIN: Warm and dry NEUROLOGIC:  Alert and oriented x 3 PSYCHIATRIC:  Normal affect   ASSESSMENT:    1. SVT (supraventricular tachycardia)   2. Palpitations   3.  Precordial pain    PLAN:    In order of problems listed above:  History of SVT s/p RFA 05/2020.  Palpitations, in the context of drinking red bull and stress.  Obtain echocardiogram, place cardiac monitor to evaluate SVT recurrence if any.  If patient stays asymptomatic, and cardiac testing normal, will continue to monitor off medications. Chest pain, not consistent with angina.  Evaluate for structural abnormalities as above with echo.  No cardiac risk factors.  Follow-up after echo and cardiac monitor       Medication Adjustments/Labs and Tests Ordered: Current medicines are reviewed at length with the patient today.  Concerns regarding medicines are outlined above.  Orders Placed This Encounter  Procedures   LONG TERM MONITOR (3-14 DAYS)   EKG 12-Lead   ECHOCARDIOGRAM COMPLETE   No orders of the defined types were placed in this encounter.   Patient Instructions  Medication Instructions:   Your physician recommends that you continue on your current medications as directed. Please refer to the Current Medication list given to you today.  *If you need a refill on your cardiac medications before your next appointment, please call your pharmacy*   Lab Work:  None Ordered  If you have labs (blood work) drawn today and your tests are completely normal, you will receive your results only by: [place] (if you have MyChart) OR A paper copy in the mail If you have any lab test that is abnormal or we need to change your treatment, we will call you to review the results.   Testing/Procedures:  Echocardiogram   Your physician has requested that you have an echocardiogram. Echocardiography is a painless test that uses sound waves to create images of your heart. It provides your doctor with information about the size and shape of your heart and how well your heart's chambers and valves are working. This procedure takes approximately one hour. There are no restrictions for  this procedure. Please note; depending on visual quality an IV may need to be placed.   2. Your physician has recommended that you wear a Zio monitor.   This monitor is a medical device that records the heart's electrical activity. Doctors most often use these monitors to diagnose arrhythmias. Arrhythmias are problems with the speed or rhythm of the heartbeat. The monitor is a small device applied to your chest. You can wear one while you do your normal daily activities. While wearing this monitor if you have any symptoms to push the button and record what you felt. Once you have worn this monitor for the period of time provider prescribed (Usually 14 days), you will return the monitor device  in the postage paid box. Once it is returned they will download the data collected and provide [place] with a report which the provider will then review and we will call you with those results. Important tips:  Avoid showering during the first 24 hours of wearing the monitor. Avoid excessive sweating to help maximize wear time. Do not submerge the device, no hot tubs, and no swimming pools. Keep any lotions or oils away from the patch. After 24 hours you may shower with the patch on. Take brief showers with your back facing the shower head.  Do not remove patch once it has been placed because that will interrupt data and decrease adhesive wear time. Push the button when you have any symptoms and write down what you were feeling. Once you have completed wearing your monitor, remove and place into box which has postage paid and place in your outgoing mailbox.  If for some reason you have misplaced your box then call our office and we can provide another box and/or mail it off for you.      Follow-Up: At [facility], you and your health needs are our priority.  As part of our continuing mission to provide you with exceptional heart care, we have created designated Provider Care Teams.  These Care Teams  include your primary Cardiologist (physician) and Advanced Practice Providers (APPs -  Physician Assistants and Nurse Practitioners) who all work together to provide you with the care you need, when you need it.  We recommend signing up for the patient portal called "MyChart".  Sign up information is provided on this After Visit Summary.  MyChart is used to connect with patients for Virtual Visits (Telemedicine).  Patients are able to view lab/test results, encounter notes, upcoming appointments, etc.  Non-urgent messages can be sent to your provider as well.   To learn more about what you can do with MyChart, go to [email or web address].    Your next appointment:    8-10 weeks  Provider:   You may see Kate Sable, MD or one of the following Advanced Practice Providers on your designated Care Team:   Murray Hodgkins, NP Christell Faith, PA-C Cadence Kathlen Mody, PA-C Gerrie Nordmann, NP   Signed, Kate Sable, MD  04/22/2022 9:34 AM    [place]

## 2022-04-27 DIAGNOSIS — I471 Supraventricular tachycardia, unspecified: Secondary | ICD-10-CM | POA: Diagnosis not present

## 2022-04-27 DIAGNOSIS — R072 Precordial pain: Secondary | ICD-10-CM | POA: Diagnosis not present

## 2022-04-28 ENCOUNTER — Telehealth: Payer: Self-pay | Admitting: Cardiology

## 2022-04-28 NOTE — Telephone Encounter (Signed)
Preoperative team, patient has a history of SVT.  She is currently waiting on echocardiogram and results of cardiac event monitor which she is wearing.  Please contact requesting office and let them know that she will have to reschedule her appointment until after we are able to review results.  Thank you for your help.  Jossie Ng. Ikran Patman NP-C     04/28/2022, 4:49 PM [address] Office [phone] Fax [phone]

## 2022-04-28 NOTE — Telephone Encounter (Signed)
     Pre-operative Risk Assessment    Patient Name: Ashley Herman  DOB: Apr 08, 1996 MRN: [id number]     Request for Surgical Clearance    Procedure:   Dental Filling 3 teeth  Date of Surgery:  Clearance 05/10/22                                 Surgeon:  Dr. Sibyl Parr  [facility]geon's Group or [facility]  Phone number:  [phone] Fax number: [phone]   Type of Clearance Requested:   - Medical    Type of Anesthesia:   numbing agent with epinephrine   Additional requests/questions:   per Sharyn Lull with [facility], they need clearance since pt is wearing heart monitor, they will be using numbing agent with epinephrine.   Signed, Selinda Orion   04/28/2022, 4:16 PM

## 2022-04-29 NOTE — Telephone Encounter (Signed)
Faxed [facility] information below.

## 2022-05-03 ENCOUNTER — Encounter: Payer: Self-pay | Admitting: Family

## 2022-05-03 ENCOUNTER — Ambulatory Visit: Payer: Medicaid Other | Admitting: Family

## 2022-05-03 VITALS — BP 110/72 | HR 83 | Temp 98.1°F | Ht 61.0 in | Wt 138.6 lb

## 2022-05-03 DIAGNOSIS — J3089 Other allergic rhinitis: Secondary | ICD-10-CM | POA: Diagnosis not present

## 2022-05-03 DIAGNOSIS — J0110 Acute frontal sinusitis, unspecified: Secondary | ICD-10-CM

## 2022-05-03 MED ORDER — FLUTICASONE PROPIONATE 50 MCG/ACT NA SUSP: 2.0000 | Freq: Every day | NASAL | 6 refills | Status: DC

## 2022-05-03 MED ORDER — AMOXICILLIN-POT CLAVULANATE 875-125 MG PO TABS: 1.0000 | ORAL_TABLET | Freq: Two times a day (BID) | ORAL | 0 refills | Status: DC

## 2022-05-03 NOTE — Assessment & Plan Note (Addendum)
Rx sent to patient's pharmacy for Augment 875-125mg . Prescription given for augmentin 875/125 mg po bid for ten days. Pt to continue tylenol/ibuprofen prn sinus pain. Continue with humidifier prn and steam showers recommended as well. instructed If no symptom improvement in 48 hours please f/u

## 2022-05-03 NOTE — Progress Notes (Signed)
Established Patient Office Visit  Subjective:   Patient ID: Ashley Herman, female    DOB: [birth year]  Age: 27 y.o. MRN: [id number]  CC:  Chief Complaint  Patient presents with   Headache    Right side for 1 week.     HPI: Ashley Herman is a 27 y.o. female presenting on 05/03/2022 for Headache (Right side for 1 week. )  Was treated for right sided conjunctivitis one week ago, started eye drops and the symptoms improved.   Light sensitivity in general over the last week or so, which has improved with the treatment of conjunctivitis. No crusting in the am with her eye.   With some pain above the right eye, worse with bending over. Some pnd and a dry non productive cough. No nasal congestion. No ear pain, some fullness right ear but she states has had a fuzzy feeling over the last year mainly when she laughs.      ROS: Negative unless specifically indicated above in HPI.   Relevant past medical history reviewed and updated as indicated.   Allergies and medications reviewed and updated.   Current Outpatient Medications:    amoxicillin-clavulanate (AUGMENTIN) 875-125 MG tablet, Take 1 tablet by mouth 2 (two) times daily., Disp: 20 tablet, Rfl: 0   fluticasone (FLONASE) 50 MCG/ACT nasal spray, Place 2 sprays into both nostrils daily., Disp: 16 g, Rfl: 6  No Known Allergies  Objective:   BP 110/72   Pulse 83   Temp 98.1 F (36.7 C) (Oral)   SpO2 98%    Physical Exam Constitutional:      General: She is not in acute distress.    Appearance: Normal appearance. She is well-developed and normal weight. She is not ill-appearing, toxic-appearing or diaphoretic.  HENT:     Head: Normocephalic.     Right Ear: Tympanic membrane normal.     Left Ear: Tympanic membrane normal.     Nose: Congestion present.     Right Turbinates: Enlarged. Not swollen.     Left Turbinates: Enlarged and swollen.     Right Sinus: Frontal sinus tenderness (mild) present. No maxillary sinus  tenderness.     Left Sinus: No maxillary sinus tenderness or frontal sinus tenderness.     Mouth/Throat:     Mouth: Mucous membranes are dry.     Pharynx: No oropharyngeal exudate or posterior oropharyngeal erythema.  Eyes:     Extraocular Movements: Extraocular movements intact.     Pupils: Pupils are equal, round, and reactive to light.  Cardiovascular:     Rate and Rhythm: Normal rate and regular rhythm.     Pulses: Normal pulses.     Heart sounds: Normal heart sounds.  Pulmonary:     Effort: Pulmonary effort is normal.     Breath sounds: Normal breath sounds.  Musculoskeletal:     Cervical back: Normal range of motion.  Neurological:     General: No focal deficit present.     Mental Status: She is alert and oriented to person, place, and time. Mental status is at baseline.  Psychiatric:        Mood and Affect: Mood normal.        Behavior: Behavior normal.        Thought Content: Thought content normal.        Judgment: Judgment normal.     Assessment & Plan:  Acute non-recurrent frontal sinusitis Assessment & Plan: Rx sent to patient's pharmacy for Augment 875-125mg . Prescription given for augmentin  875/125 mg po bid for ten days. Pt to continue tylenol/ibuprofen prn sinus pain. Continue with humidifier prn and steam showers recommended as well. instructed If no symptom improvement in 48 hours please f/u   Orders: -     Amoxicillin-Pot Clavulanate; Take 1 tablet by mouth 2 (two) times daily.  Dispense: 20 tablet; Refill: 0  Non-seasonal allergic rhinitis, unspecified trigger Assessment & Plan: RX sent to patient's pharmacy for Flonase 41mcg 1 spray in each nostril daily.  Advised to take Allegra daily on a consistent basis.   I evaluated the patient,  was consulted regarding plans for treatment of care, and agree with the assessment and plan per Joya Gaskins, RN, DNP student.  Eugenia Pancoast, FNP-C   Orders: -     Amoxicillin-Pot Clavulanate; Take 1 tablet by mouth  2 (two) times daily.  Dispense: 20 tablet; Refill: 0 -     Fluticasone Propionate; Place 2 sprays into both nostrils daily.  Dispense: 16 g; Refill: 6     Follow up plan: Return if symptoms worsen or fail to improve.  Eugenia Pancoast, FNP

## 2022-05-03 NOTE — Patient Instructions (Signed)
  Recommend flonase 50 mcg once daily, I would suggest 1 spray each nares once daily.  Be more consistent with daily allegra at least for the next few weeks.   Take antibiotic as prescribed. Increase oral fluids. Pt to f/u if sx worsen and or fail to improve in 2-3 days.   Regards,   Eugenia Pancoast FNP-C

## 2022-05-03 NOTE — Assessment & Plan Note (Addendum)
RX sent to patient's pharmacy for Flonase 31mcg 1 spray in each nostril daily.  Advised to take Allegra daily on a consistent basis.   I evaluated the patient,  was consulted regarding plans for treatment of care, and agree with the assessment and plan per Joya Gaskins, RN, DNP student.  -Eugenia Pancoast, FNP-C

## 2022-05-03 NOTE — Progress Notes (Signed)
Patient Office Visit  Subjective:  Patient ID: Ashley Herman, female    DOB: 05/15/1995  Age: 27 y.o. MRN: [id number]  CC:  Chief Complaint  Patient presents with   Headache    Right side for 1 week.     HPI Ashley Herman is a 27 year old female here today with acute concerns.   Right side head pain: reports pain started on 04/22/2022 when she started treatment for conjunctivitis of her right eye. States most of her eye symptoms have resolved besides photosensitivity.  Describes pain has persistent, sharp, and stabbing pain. She has tried to take Tylenol and Advil with no relief. Pain get worse when she bends down. She states that she has about 3-4 episodes that occur daily that last 1 minute a each. Denies changes in vision, mental status,numbness/tingling, nausea/vomiting. Patient does reports that she has had fullness/ "fuzzy" feeling in her right ear over the past year. States that she has had some post nasal drip but no congestion. Reports that she has developed a dry cough 2 days ago.   Palpitations: saw cardiologist on 04/21/2021. Patient is currently. wearing a heart monitor and has a Echo scheduled 05/13/2022. Denies chest pain, palpitations, shortness of breath  Past Medical History:  Diagnosis Date   Anemia    H/O prior ablation treatment 05/26/2020   [place]    No past surgical history on file.  No family history on file.  Social History   Socioeconomic History   Marital status: Single    Spouse name: Not on file   Number of children: Not on file   Years of education: Not on file   Highest education level: Not on file  Occupational History   Not on file  Tobacco Use   Smoking status: Never    Passive exposure: Never   Smokeless tobacco: Never  Vaping Use   Vaping Use: Never used  Substance and Sexual Activity   Alcohol use: No    Comment: occasional   Drug use: No   Sexual activity: Yes    Birth control/protection: Condom  Other Topics Concern   Not on  file  Social History Narrative   Not on file   Social Determinants of Health   Financial Resource Strain: Not on file  Food Insecurity: Not on file  Transportation Needs: Not on file  Physical Activity: Not on file  Stress: Not on file  Social Connections: Not on file  Intimate Partner Violence: Not on file    Outpatient Medications Prior to Visit  Medication Sig Dispense Refill   cyclobenzaprine (FLEXERIL) 10 MG tablet Take 1 tablet (10 mg total) by mouth 2 (two) times daily as needed for muscle spasms. (Patient not taking: Reported on 04/22/2022) 20 tablet 0   ergocalciferol (DRISDOL) 50000 units capsule Take 1 capsule (50,000 Units total) by mouth once a week. (Patient not taking: Reported on 05/26/2017) 12 capsule 3   ferrous sulfate 325 (65 FE) MG tablet Take 1 tablet (325 mg total) by mouth daily. (Patient not taking: Reported on 05/26/2017) 30 tablet 3   No facility-administered medications prior to visit.    No Known Allergies  ROS: Negative unless otherwise states in HPI    Objective:    Physical Exam Vitals reviewed.  Constitutional:      Appearance: Normal appearance. She is well-developed.  HENT:     Right Ear: Hearing, tympanic membrane and external ear normal.     Left Ear: Hearing, tympanic membrane and external ear normal.  Nose: Congestion present.     Right Turbinates: Enlarged. Not swollen.     Left Turbinates: Enlarged and swollen.     Right Sinus: Frontal sinus tenderness present.     Mouth/Throat:     Pharynx: Oropharynx is clear. No pharyngeal swelling, oropharyngeal exudate or posterior oropharyngeal erythema.  Eyes:     General:        Right eye: No discharge.        Left eye: No discharge.     Extraocular Movements: Extraocular movements intact.     Conjunctiva/sclera: Conjunctivae normal.     Pupils: Pupils are equal, round, and reactive to light.     Comments: Photosensitivity present in right eye.   Cardiovascular:     Rate and Rhythm:  Normal rate and regular rhythm.     Pulses: Normal pulses.     Heart sounds: Normal heart sounds.  Pulmonary:     Effort: Pulmonary effort is normal.     Breath sounds: Normal breath sounds.  Skin:    General: Skin is warm and dry.  Neurological:     General: No focal deficit present.     Mental Status: She is alert and oriented to person, place, and time.  Psychiatric:        Mood and Affect: Mood normal.        Speech: Speech normal.        Behavior: Behavior normal.        Thought Content: Thought content normal.        Judgment: Judgment normal.     BP 110/72   Pulse 83   Temp 98.1 F (36.7 C) (Oral)   SpO2 98%  Wt Readings from Last 3 Encounters:  04/22/22 139 lb 3.2 oz (63.1 kg)  05/26/17 116 lb (52.6 kg)  03/09/17 113 lb (51.3 kg)     Health Maintenance Due  Topic Date Due   Hepatitis C Screening  Never done   COVID-19 Vaccine (3 - 2023-24 season) 12/10/2021    There are no preventive care reminders to display for this patient.  Lab Results  Component Value Date   TSH 0.58 03/01/2017   Lab Results  Component Value Date   WBC 7.6 04/17/2022   HGB 13.7 04/17/2022   HCT 44.3 04/17/2022   MCV 90.8 04/17/2022   PLT 425 (H) 04/17/2022   Lab Results  Component Value Date   NA 135 04/17/2022   K 3.9 04/17/2022   CO2 25 04/17/2022   GLUCOSE 109 (H) 04/17/2022   BUN 11 04/17/2022   CREATININE 0.82 04/17/2022   BILITOT 0.4 03/01/2017   ALKPHOS 59 01/31/2017   AST 22 03/01/2017   ALT 17 03/01/2017   PROT 7.9 03/01/2017   ALBUMIN 3.8 01/31/2017   CALCIUM 9.0 04/17/2022   ANIONGAP 8 04/17/2022   No results found for: "HGBA1C"    Assessment & Plan:   Problem List Items Addressed This Visit       Respiratory   Acute non-recurrent frontal sinusitis - Primary    Rx sent to patient's pharmacy for Augment 875-125mg . Advised patient to notifiy provider if symptoms worsen or do not improve in 2-3 days.       Relevant Medications    amoxicillin-clavulanate (AUGMENTIN) 875-125 MG tablet   fluticasone (FLONASE) 50 MCG/ACT nasal spray   Non-seasonal allergic rhinitis    RX sent to patient's pharmacy for Flonase 1 spray in each nostril daily.  Advised to take Allegra daily on a consistent  basis.       Relevant Medications   amoxicillin-clavulanate (AUGMENTIN) 875-125 MG tablet   fluticasone (FLONASE) 50 MCG/ACT nasal spray    Meds ordered this encounter  Medications   amoxicillin-clavulanate (AUGMENTIN) 875-125 MG tablet    Sig: Take 1 tablet by mouth 2 (two) times daily.    Dispense:  20 tablet    Refill:  0    Order Specific Question:   Supervising Provider    Answer:   BEDSOLE, AMY E [[id number]]   fluticasone (FLONASE) 50 MCG/ACT nasal spray    Sig: Place 2 sprays into both nostrils daily.    Dispense:  16 g    Refill:  6    Order Specific Question:   Supervising Provider    Answer:   BEDSOLE, AMY E [[id number]]    Follow-up: Return if symptoms worsen or fail to improve.    Johny Shock, RN AGNP-student

## 2022-05-05 ENCOUNTER — Other Ambulatory Visit (HOSPITAL_COMMUNITY)
Admission: RE | Admit: 2022-05-05 | Discharge: 2022-05-05 | Disposition: A | Discharge status: Home / Self Care | Payer: Medicaid Other | Source: Ambulatory Visit | Attending: Family Medicine | Admitting: Family Medicine

## 2022-05-05 ENCOUNTER — Ambulatory Visit: Payer: Medicaid Other | Admitting: Advanced Practice Midwife

## 2022-05-05 ENCOUNTER — Encounter: Payer: Self-pay | Admitting: Advanced Practice Midwife

## 2022-05-05 VITALS — BP 119/75 | HR 87 | Ht 61.0 in | Wt 138.0 lb

## 2022-05-05 DIAGNOSIS — Z975 Presence of (intrauterine) contraceptive device: Secondary | ICD-10-CM

## 2022-05-05 DIAGNOSIS — Z01419 Encounter for gynecological examination (general) (routine) without abnormal findings: Secondary | ICD-10-CM | POA: Diagnosis not present

## 2022-05-05 DIAGNOSIS — Z113 Encounter for screening for infections with a predominantly sexual mode of transmission: Secondary | ICD-10-CM | POA: Diagnosis present

## 2022-05-05 DIAGNOSIS — I471 Supraventricular tachycardia, unspecified: Secondary | ICD-10-CM

## 2022-05-05 NOTE — Progress Notes (Signed)
GYNECOLOGY ANNUAL PREVENTATIVE CARE ENCOUNTER NOTE  History:     Ashley Herman is a 27 y.o. No obstetric history on file. female here for a routine annual gynecologic exam.  Current complaints: none.   Denies abnormal vaginal bleeding, discharge, pelvic pain, problems with intercourse or other gynecologic concerns.   Patient just finished aesthetician school. 1 female partner since Sept 2023. Lives with boyfriend and her parents.    Gynecologic History No LMP recorded. (Menstrual status: IUD). Contraception: IUD Last Pap: 11/2020. Result was normal   Obstetric History OB History  No obstetric history on file.    Past Medical History:  Diagnosis Date   Anemia    H/O prior ablation treatment 05/26/2020   [place]    History reviewed. No pertinent surgical history.  Current Outpatient Medications on File Prior to Visit  Medication Sig Dispense Refill   amoxicillin-clavulanate (AUGMENTIN) 875-125 MG tablet Take 1 tablet by mouth 2 (two) times daily. 20 tablet 0   fluticasone (FLONASE) 50 MCG/ACT nasal spray Place 2 sprays into both nostrils daily. 16 g 6   No current facility-administered medications on file prior to visit.    No Known Allergies  Social History:  reports that she has never smoked. She has never been exposed to tobacco smoke. She has never used smokeless tobacco. She reports that she does not drink alcohol and does not use drugs.  History reviewed. No pertinent family history.  The following portions of the patient's history were reviewed and updated as appropriate: allergies, current medications, past family history, past medical history, past social history, past surgical history and problem list.  Review of Systems Pertinent items noted in HPI and remainder of comprehensive ROS otherwise negative.  Physical Exam:  BP 119/75   Pulse 87   Ht 5\' 1"  (1.549 m)   Wt 138 lb (62.6 kg)   BMI 26.07 kg/m  CONSTITUTIONAL: Well-developed, well-nourished  female in no acute distress.  HENT:  Normocephalic, atraumatic, External right and left ear normal.  EYES: Conjunctivae and EOM are normal. Pupils are equal, round, and reactive to light. No scleral icterus.  SKIN: Skin is warm and dry. No rash noted. Not diaphoretic. No erythema. No pallor. MUSCULOSKELETAL: Normal range of motion. No tenderness.  No cyanosis, clubbing, or edema. NEUROLOGIC: Alert and oriented to person, place, and time. Normal reflexes, muscle tone coordination.  PSYCHIATRIC: Normal mood and affect. Normal behavior. Normal judgment and thought content. CARDIOVASCULAR: Normal heart rate noted, regular rhythm RESPIRATORY: Clear to auscultation bilaterally. Effort and breath sounds normal, no problems with respiration noted. BREASTS: Deferred ABDOMEN: Soft, no distention noted.  No tenderness, rebound or guarding.  PELVIC: Normal appearing external genitalia and urethral meatus; normal appearing vaginal mucosa and cervix.  No abnormal vaginal discharge noted.  IUD strings visible.  Chaperone declined by patient   Assessment and Plan:    1. Well woman exam with routine gynecological exam - No concerning findings  2. Screening for STD (sexually transmitted disease) - Per patient request - Urine cytology ancillary only - RPR+HBsAg+HCVAb+...  3. IUD (intrauterine device) in place - Kyleena placed 11/2020 - Strings visible and appropriate length - Discussed expected return of fertility 4-6 weeks after removal   4. SVT (supraventricular tachycardia) - Has local Cardiology team for management of symptoms, no referral needed  NILM on Pap 11/2020. Pap due 11/2023. RTC in one year for well woman exam.   Routine preventative health maintenance measures emphasized. Please refer to After Visit Summary for  other counseling recommendations.     Mallie Snooks, [place], MSN, CNM Certified Nurse Midwife, Product/process development scientist for [employer], [place]

## 2022-05-05 NOTE — Progress Notes (Signed)
New Patient presents for Annual and to establish care.  UJW:JXBJ with IUD  Last pap:2022 WNL  Contraception:Kyleena 08/222 Mammogram:N/A STD Screening: Desired Full Panel   CC: None   Fun Fact: Patient from [place],  likes dance and travel.

## 2022-05-06 LAB — URINE CYTOLOGY ANCILLARY ONLY
Chlamydia: NEGATIVE
Comment: NEGATIVE
Comment: NORMAL
Neisseria Gonorrhea: NEGATIVE

## 2022-05-06 LAB — RPR+HBSAG+HCVAB+...
HIV Screen 4th Generation wRfx: NONREACTIVE
Hep C Virus Ab: NONREACTIVE
Hepatitis B Surface Ag: NEGATIVE
RPR Ser Ql: NONREACTIVE

## 2022-05-13 ENCOUNTER — Ambulatory Visit: Payer: Medicaid Other | Attending: Cardiology

## 2022-05-13 DIAGNOSIS — I471 Supraventricular tachycardia, unspecified: Secondary | ICD-10-CM

## 2022-05-13 DIAGNOSIS — R072 Precordial pain: Secondary | ICD-10-CM

## 2022-05-13 LAB — ECHOCARDIOGRAM COMPLETE
Area-P 1/2: 4.29 cm2
S' Lateral: 2.8 cm

## 2022-05-13 NOTE — Telephone Encounter (Signed)
Please check to see if clearance is still needed. The dental procedure date on the original request for 1/30

## 2022-05-13 NOTE — Telephone Encounter (Signed)
Dental office is closed. Will call back on Monday.

## 2022-05-16 NOTE — Progress Notes (Signed)
noted 

## 2022-05-17 NOTE — Telephone Encounter (Signed)
Left message for dental works to see if clearance is still needed.

## 2022-05-18 NOTE — Telephone Encounter (Signed)
I s/w Ashley Herman at the [place] office. I was able to confirm the procedure is still in place for 2 now fillings at this time, not 3 fillings. Procedure to be done 06/14/22 if possible. I did explain that the pt had worn a heart monitor for SVT. I will send over notes only as FYI from our conversation today. I will forward back to pre op app for pre op review.

## 2022-05-23 NOTE — Telephone Encounter (Signed)
   Patient Name: Ashley Herman  DOB: [birth year] MRN: [id number]  Primary Cardiologist: None  Chart reviewed as part of pre-operative protocol coverage. Given past medical history and time since last visit, based on ACC/AHA guidelines, [name] is at acceptable risk for the planned procedure without further cardiovascular testing.  Patient is cleared to have dental fillings with requested numbing agent with epinephrine per her cardiologist Dr. Garen Lah.   I will route this recommendation to the requesting party via Epic fax function and remove from pre-op pool.  Please call with questions.  Mable Fill, Marissa Nestle, NP 05/23/2022, 8:26 AM

## 2022-06-03 ENCOUNTER — Encounter: Payer: Self-pay | Admitting: Obstetrics and Gynecology

## 2022-06-30 ENCOUNTER — Encounter: Payer: Self-pay | Admitting: Family

## 2022-06-30 ENCOUNTER — Ambulatory Visit: Payer: Medicaid Other | Attending: Cardiology | Admitting: Cardiology

## 2022-06-30 ENCOUNTER — Ambulatory Visit: Payer: Medicaid Other | Admitting: Family

## 2022-06-30 VITALS — BP 116/78 | HR 98 | Temp 97.9°F | Ht 61.0 in | Wt 140.8 lb

## 2022-06-30 VITALS — BP 110/78 | HR 82 | Ht 61.0 in | Wt 140.0 lb

## 2022-06-30 DIAGNOSIS — R072 Precordial pain: Secondary | ICD-10-CM | POA: Diagnosis not present

## 2022-06-30 DIAGNOSIS — Z0000 Encounter for general adult medical examination without abnormal findings: Secondary | ICD-10-CM

## 2022-06-30 DIAGNOSIS — I471 Supraventricular tachycardia, unspecified: Secondary | ICD-10-CM | POA: Diagnosis not present

## 2022-06-30 DIAGNOSIS — Z111 Encounter for screening for respiratory tuberculosis: Secondary | ICD-10-CM

## 2022-06-30 DIAGNOSIS — E559 Vitamin D deficiency, unspecified: Secondary | ICD-10-CM | POA: Diagnosis not present

## 2022-06-30 DIAGNOSIS — Z20822 Contact with and (suspected) exposure to covid-19: Secondary | ICD-10-CM

## 2022-06-30 DIAGNOSIS — J3089 Other allergic rhinitis: Secondary | ICD-10-CM | POA: Diagnosis not present

## 2022-06-30 DIAGNOSIS — J029 Acute pharyngitis, unspecified: Secondary | ICD-10-CM | POA: Diagnosis not present

## 2022-06-30 DIAGNOSIS — Z13220 Encounter for screening for lipoid disorders: Secondary | ICD-10-CM | POA: Diagnosis not present

## 2022-06-30 LAB — BASIC METABOLIC PANEL
BUN: 10 mg/dL (ref 6–23)
CO2: 29 mEq/L (ref 19–32)
Calcium: 9.5 mg/dL (ref 8.4–10.5)
Chloride: 102 mEq/L (ref 96–112)
Creatinine, Ser: 0.73 mg/dL (ref 0.40–1.20)
GFR: 114.1 mL/min (ref >60.00)
Glucose, Bld: 103 mg/dL — ABNORMAL HIGH (ref 70–99)
Potassium: 3.9 mEq/L (ref 3.5–5.1)
Sodium: 138 mEq/L (ref 135–145)

## 2022-06-30 LAB — CBC
HCT: 41.6 % (ref 36.0–46.0)
Hemoglobin: 13.5 g/dL (ref 12.0–15.0)
MCHC: 32.5 g/dL (ref 30.0–36.0)
MCV: 86.2 fl (ref 78.0–100.0)
Platelets: 399 10*3/uL (ref 150.0–400.0)
RBC: 4.82 Mil/uL (ref 3.87–5.11)
RDW: 13.4 % (ref 11.5–15.5)
WBC: 5.2 10*3/uL (ref 4.0–10.5)

## 2022-06-30 LAB — VITAMIN D 25 HYDROXY (VIT D DEFICIENCY, FRACTURES): VITD: 19.78 ng/mL — ABNORMAL LOW (ref 30.00–100.00)

## 2022-06-30 LAB — LIPID PANEL
Cholesterol: 168 mg/dL (ref 0–200)
HDL: 73 mg/dL (ref >39.00)
LDL Cholesterol: 83 mg/dL (ref 0–99)
NonHDL: 94.61
Total CHOL/HDL Ratio: 2
Triglycerides: 56 mg/dL (ref 0.0–149.0)
VLDL: 11.2 mg/dL (ref 0.0–40.0)

## 2022-06-30 LAB — POCT RAPID STREP A (OFFICE): Rapid Strep A Screen: NEGATIVE

## 2022-06-30 LAB — POC COVID19 BINAXNOW: SARS Coronavirus 2 Ag: NEGATIVE

## 2022-06-30 NOTE — Assessment & Plan Note (Signed)
Patient Counseling(The following topics were reviewed): ? Preventative care handout given to pt  ?Health maintenance and immunizations reviewed. Please refer to Health maintenance section. ?Pt advised on safe sex, wearing seatbelts in car, and proper nutrition ?labwork ordered today for annual ?Dental health: Discussed importance of regular tooth brushing, flossing, and dental visits. ? ? ?

## 2022-06-30 NOTE — Progress Notes (Signed)
Cardiology Office Note:    Date:  06/30/2022   ID:  Ashley Herman, DOB 1995-08-13, MRN [id number]  PCP:  Eugenia Pancoast, [place] [facility] Cardiologist:  Kate Sable, MD     Referring MD: No ref. provider found   Chief Complaint  Patient presents with   Follow-up    History of Present Illness:    Ashley Herman is a 27 y.o. 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  Last seen due to palpitations in the context of drinking energy drinks.  Also states being stressed with some family issues.  Cardiac monitor was placed to evaluate any significant arrhythmias, has felt well since last visit, no further episodes of palpitations.  Echocardiogram obtained due to chest soreness which is currently resolved. presents for cardiac testing results, no new concerns at this time.  Past Medical History:  Diagnosis Date   Anemia    H/O prior ablation treatment 05/26/2020   [place]    Past Surgical History:  Procedure Laterality Date   ABLATION     BUNIONECTOMY Right     Current Medications: Current Meds  Medication Sig   fluticasone (FLONASE) 50 MCG/ACT nasal spray Place 2 sprays into both nostrils daily.     Allergies:   Patient has no known allergies.   Social History   Socioeconomic History   Marital status: Single    Spouse name: Not on file   Number of children: Not on file   Years of education: Not on file   Highest education level: Not on file  Occupational History   Occupation: daycare  Tobacco Use   Smoking status: Never    Passive exposure: Never   Smokeless tobacco: Never  Vaping Use   Vaping Use: Never used  Substance and Sexual Activity   Alcohol use: No    Comment: occasional   Drug use: No   Sexual activity: Yes    Partners: Male    Birth control/protection: I.U.D.  Other Topics Concern   Not on file  Social History Narrative   Dating a boyfriend since September    Social  Determinants of Health   Financial Resource Strain: Not on file  Food Insecurity: Not on file  Transportation Needs: Not on file  Physical Activity: Not on file  Stress: Not on file  Social Connections: Not on file     Family History: The patient's family history includes Clotting disorder in her father; Healthy in her mother; Leukemia in her father; Parkinson's disease in her father.  ROS:   Please see the history of present illness.     All other systems reviewed and are negative.  EKGs/Labs/Other Studies Reviewed:    The following studies were reviewed today:   EKG:  EKG not ordered today.    Recent Labs: 04/17/2022: BUN 11; Creatinine, Ser 0.82; Hemoglobin 13.7; Platelets 425; Potassium 3.9; Sodium 135  Recent Lipid Panel No results found for: "CHOL", "TRIG", "HDL", "CHOLHDL", "VLDL", "LDLCALC", "LDLDIRECT"   Risk Assessment/Calculations:             Physical Exam:    VS:  BP 110/78 (BP Location: Left Arm, Patient Position: Sitting, Cuff Size: Normal)   Pulse 82   Ht 5\' 1"  (1.549 m)   Wt 140 lb (63.5 kg)   SpO2 98%   BMI 26.45 kg/m     Wt Readings from Last 3 Encounters:  06/30/22 140 lb (63.5 kg)  06/30/22 140 lb 12.8 oz (63.9 kg)  05/05/22 138 lb (62.6 kg)     GEN:  Well nourished, well developed in no acute distress HEENT: Normal NECK: No JVD; No carotid bruits CARDIAC: RRR, no murmurs, rubs, gallops RESPIRATORY:  Clear to auscultation without rales, wheezing or rhonchi  ABDOMEN: Soft, non-tender, non-distended MUSCULOSKELETAL:  No edema; No deformity  SKIN: Warm and dry NEUROLOGIC:  Alert and oriented x 3 PSYCHIATRIC:  Normal affect   ASSESSMENT:    1. SVT (supraventricular tachycardia)   2. Precordial pain    PLAN:    In order of problems listed above:  History of SVT s/p RFA 05/2020.  Palpitations, in the context of drinking red bull and stress.  Cardiac monitor showed no significant arrhythmias.  Echocardiogram with normal EF 60 to 65%.   Patient made aware of results, reassured.  Advised to avoid stimulants/energy drinks.   Chest pain, not consistent with angina.  Echo with normal EF.  Follow-up yearly.     Medication Adjustments/Labs and Tests Ordered: Current medicines are reviewed at length with the patient today.  Concerns regarding medicines are outlined above.  No orders of the defined types were placed in this encounter.  No orders of the defined types were placed in this encounter.   Patient Instructions  Medication Instructions:   Your physician recommends that you continue on your current medications as directed. Please refer to the Current Medication list given to you today.  *If you need a refill on your cardiac medications before your next appointment, please call your pharmacy*   Lab Work:  None Ordered  If you have labs (blood work) drawn today and your tests are completely normal, you will receive your results only by: [place] (if you have MyChart) OR A paper copy in the mail If you have any lab test that is abnormal or we need to change your treatment, we will call you to review the results.   Testing/Procedures:  None Ordered    Follow-Up: At [facility], you and your health needs are our priority.  As part of our continuing mission to provide you with exceptional heart care, we have created designated Provider Care Teams.  These Care Teams include your primary Cardiologist (physician) and Advanced Practice Providers (APPs -  Physician Assistants and Nurse Practitioners) who all work together to provide you with the care you need, when you need it.  We recommend signing up for the patient portal called "MyChart".  Sign up information is provided on this After Visit Summary.  MyChart is used to connect with patients for Virtual Visits (Telemedicine).  Patients are able to view lab/test results, encounter notes, upcoming appointments, etc.  Non-urgent messages can be sent to  your provider as well.   To learn more about what you can do with MyChart, go to [email or web address].    Your next appointment:   12 months  Provider:   You may see Kate Sable, MD or one of the following Advanced Practice Providers on your designated Care Team:   Murray Hodgkins, NP Christell Faith, PA-C Cadence Kathlen Mody, PA-C Gerrie Nordmann, NP    Signed, Kate Sable, MD  06/30/2022 12:17 PM    [place]

## 2022-06-30 NOTE — Patient Instructions (Signed)
Medication Instructions:   Your physician recommends that you continue on your current medications as directed. Please refer to the Current Medication list given to you today.  *If you need a refill on your cardiac medications before your next appointment, please call your pharmacy*   Lab Work:  None Ordered  If you have labs (blood work) drawn today and your tests are completely normal, you will receive your results only by: MyChart Message (if you have MyChart) OR A paper copy in the mail If you have any lab test that is abnormal or we need to change your treatment, we will call you to review the results.   Testing/Procedures:  None Ordered    Follow-Up: At [facility], you and your health needs are our priority.  As part of our continuing mission to provide you with exceptional heart care, we have created designated Provider Care Teams.  These Care Teams include your primary Cardiologist (physician) and Advanced Practice Providers (APPs -  Physician Assistants and Nurse Practitioners) who all work together to provide you with the care you need, when you need it.  We recommend signing up for the patient portal called "MyChart".  Sign up information is provided on this After Visit Summary.  MyChart is used to connect with patients for Virtual Visits (Telemedicine).  Patients are able to view lab/test results, encounter notes, upcoming appointments, etc.  Non-urgent messages can be sent to your provider as well.   To learn more about what you can do with MyChart, go to [email or web address]://[email or web address].com.    Your next appointment:   12 month(s)  Provider:   You may see Brian Agbor-Etang, MD or one of the following Advanced Practice Providers on your designated Care Team:   Christopher Berge, NP Ryan Dunn, PA-C Cadence Furth, PA-C Sheri Hammock, NP  

## 2022-06-30 NOTE — Assessment & Plan Note (Signed)
Ordered vitamin d pending results.   

## 2022-06-30 NOTE — Patient Instructions (Signed)
  Stop by the lab prior to leaving today. I will notify you of your results once received.   Recommendations on keeping yourself healthy:  - Exercise at least 30-45 minutes a day, 3-4 days a week.  - Eat a low-fat diet with lots of fruits and vegetables, up to 7-9 servings per day.  - Seatbelts can save your life. Wear them always.  - Smoke detectors on every level of your home, check batteries every year.  - Eye Doctor - have an eye exam every 1-2 years  - Safe sex - if you may be exposed to STDs, use a condom.  - Alcohol -  If you drink, do it moderately, less than 2 drinks per day.  - Health Care Power of Attorney. Choose someone to speak for you if you are not able.  - Depression is common in our stressful world.If you're feeling down or losing interest in things you normally enjoy, please come in for a visit.  - Violence - If anyone is threatening or hurting you, please call immediately.  Due to recent changes in healthcare laws, you may see results of your imaging and/or laboratory studies on MyChart before I have had a chance to review them.  I understand that in some cases there may be results that are confusing or concerning to you. Please understand that not all results are received at the same time and often I may need to interpret multiple results in order to provide you with the best plan of care or course of treatment. Therefore, I ask that you please give me 2 business days to thoroughly review all your results before contacting my office for clarification. Should we see a critical lab result, you will be contacted sooner.   I will see you again in one year for your annual comprehensive exam unless otherwise stated and or with acute concerns.  It was a pleasure seeing you today! Please do not hesitate to reach out with any questions and or concerns.  Regards,   Glorianna Gott    

## 2022-06-30 NOTE — Assessment & Plan Note (Signed)
Stable Continue f/u with cardiologist as scheduled. 

## 2022-06-30 NOTE — Progress Notes (Signed)
Established Patient Office Visit  Subjective:      CC:  Chief Complaint  Patient presents with   Establish Care   Annual Exam    HPI: Dymple Gatica is a 27 y.o. female presenting on 06/30/2022 for Establish Care and Annual Exam .and here for a physical exam.    New complaints: Six days ago started with cough (non productive) with some mild chest congestion. Also with some ear fullness, slight sore throat, and headache, worse with bending over and coming back up. Slight nasal congestion, on and off. No fever.  Works in [employer].   Allergic rhinitis: using flonase often.   SVT: sees cardiology today for her 2 month f/u.  Test results so far have been ok, so far as had holter monitor x 14 days. Pending results. Has had ablation in the past.  Echocardiogram: 05/13/22 normal findings.   Pap: due again next year, strings were still in place with IUD  Menses: doesn't get them with IUD in place.  Dental: up to date. Last one was 05/23/22 Eye exam: overdue will make appt  Denies known h/o STD  Exercise: gym very rarely maybe once a week or two  Diet: limits fast food and fried fatty, prefer water, cranberry juice, fruits   Completed TB risk questionnaire as follows:  Were you born outside [place] in [place], [place], central [place], [place] or [place]? No.  Have you traveled outside of [place] and lived for more than one month? No.   Do you have a compromised immune system such as from the following: HIV/AIDs, organ or bone  marrow transplant, diabetes, immunosuppressive medications, leukemia, lymphoma, cancer of head or neck, gastrectomy, or jejunal bypass, end stage renal disease on dialysis or silicosis? No.  Have you ever done one of the following? Used crack cocaine, injected illegal drugs, worked or resided in jail or prison, worked or resided at a homeless shelter, or worked as a Dietitian in [employer] with patients? No.  Have you ever been exposed to anyone  with infectious tb? No.  Completed TB symptom questionnaire as follows:   Do you currently have any of the following symptoms?  Unexplained cough more than 3 weeks? No. Unexplained fever lasting more than 3 weeks? No. Night sweats, that are leaving bedclothes and sheets wet? No. Shortness of breath? No. Chest pain? No. Unintentional weight loss? No. Unexplained fatigued? No.   Social history:  Relevant past medical, surgical, family and social history reviewed and updated as indicated. Interim medical history since our last visit reviewed.  Allergies and medications reviewed and updated.  DATA REVIEWED: CHART IN EPIC     ROS: Negative unless specifically indicated above in HPI.    Current Outpatient Medications:    fluticasone (FLONASE) 50 MCG/ACT nasal spray, Place 2 sprays into both nostrils daily., Disp: 16 g, Rfl: 6      Objective:    BP 116/78   Pulse 98   Temp 97.9 F (36.6 C) (Temporal)   Ht 5\' 1"  (1.549 m)   Wt 140 lb 12.8 oz (63.9 kg)   SpO2 99%   BMI 26.60 kg/m   Wt Readings from Last 3 Encounters:  06/30/22 140 lb (63.5 kg)  06/30/22 140 lb 12.8 oz (63.9 kg)  05/05/22 138 lb (62.6 kg)    Physical Exam Constitutional:      General: She is not in acute distress.    Appearance: Normal appearance. She is normal weight. She is not ill-appearing.  HENT:     Head: Normocephalic.     Right Ear: Tympanic membrane normal.     Left Ear: Tympanic membrane normal.     Nose: Nose normal.     Mouth/Throat:     Mouth: Mucous membranes are moist.  Eyes:     Extraocular Movements: Extraocular movements intact.     Pupils: Pupils are equal, round, and reactive to light.  Cardiovascular:     Rate and Rhythm: Normal rate and regular rhythm.  Pulmonary:     Effort: Pulmonary effort is normal.     Breath sounds: Normal breath sounds.  Abdominal:     General: Abdomen is flat. Bowel sounds are normal.     Palpations: Abdomen is soft.     Tenderness: There is  no guarding or rebound.  Musculoskeletal:        General: Normal range of motion.     Cervical back: Normal range of motion.  Skin:    General: Skin is warm.     Capillary Refill: Capillary refill takes less than 2 seconds.  Neurological:     General: No focal deficit present.     Mental Status: She is alert.  Psychiatric:        Mood and Affect: Mood normal.        Behavior: Behavior normal.        Thought Content: Thought content normal.        Judgment: Judgment normal.            Assessment & Plan:  Vitamin D deficiency Assessment & Plan: Ordered vitamin d pending results.    Orders: -     VITAMIN D 25 Hydroxy (Vit-D Deficiency, Fractures)  Non-seasonal allergic rhinitis, unspecified trigger Assessment & Plan: Continue flonase    SVT (supraventricular tachycardia) Assessment & Plan: Stable  Continue f/u with cardiologist as scheduled.   Sore throat -     POCT rapid strep A  Contact with and (suspected) exposure to covid-19 -     POC COVID-19 BinaxNow  Screening for tuberculosis -     QuantiFERON-TB Gold Plus  Encounter for general adult medical examination without abnormal findings Assessment & Plan: Patient Counseling(The following topics were reviewed):  Preventative care handout given to pt  Health maintenance and immunizations reviewed. Please refer to Health maintenance section. Pt advised on safe sex, wearing seatbelts in car, and proper nutrition labwork ordered today for annual Dental health: Discussed importance of regular tooth brushing, flossing, and dental visits.   Orders: -     Lipid panel -     Basic metabolic panel -     CBC  Screening for lipoid disorders -     Lipid panel     Return in about 1 year (around 06/30/2023) for f/u CPE.  Eugenia Pancoast, MSN, APRN, FNP-C [place]

## 2022-06-30 NOTE — Assessment & Plan Note (Signed)
Continue flonase 

## 2022-07-02 LAB — QUANTIFERON-TB GOLD PLUS
Mitogen-NIL: >10 IU/mL
NIL: 0.02 IU/mL
QuantiFERON-TB Gold Plus: NEGATIVE
TB1-NIL: 0 IU/mL
TB2-NIL: 0.01 IU/mL

## 2022-07-06 ENCOUNTER — Encounter: Payer: Self-pay | Admitting: Family

## 2022-07-07 ENCOUNTER — Other Ambulatory Visit: Payer: Self-pay

## 2022-07-07 MED ORDER — VITAMIN D (ERGOCALCIFEROL) 1.25 MG (50000 UNIT) PO CAPS: 50000.0000 [IU] | ORAL_CAPSULE | ORAL | 1 refills | Status: DC

## 2022-07-26 ENCOUNTER — Ambulatory Visit: Payer: Medicaid Other | Admitting: Family

## 2022-07-26 ENCOUNTER — Encounter: Payer: Self-pay | Admitting: Family

## 2022-07-26 VITALS — BP 112/70 | HR 74 | Temp 97.9°F | Ht 61.0 in | Wt 141.6 lb

## 2022-07-26 DIAGNOSIS — B359 Dermatophytosis, unspecified: Secondary | ICD-10-CM | POA: Insufficient documentation

## 2022-07-26 DIAGNOSIS — B351 Tinea unguium: Secondary | ICD-10-CM | POA: Insufficient documentation

## 2022-07-26 MED ORDER — METRONIDAZOLE 1 % EX GEL: Freq: Every day | CUTANEOUS | 0 refills | Status: DC

## 2022-07-26 MED ORDER — TERBINAFINE HCL 250 MG PO TABS: 250.0000 mg | ORAL_TABLET | Freq: Every day | ORAL | 0 refills | Status: AC

## 2022-07-26 MED ORDER — CICLOPIROX 8 % EX SOLN: Freq: Every day | CUTANEOUS | 0 refills | Status: DC

## 2022-07-26 NOTE — Assessment & Plan Note (Signed)
With combination of suspected tinea on face as well as toenail fungus going to treat with terbinafine 250 mg one po qd for 14 days.

## 2022-07-26 NOTE — Progress Notes (Signed)
   Established Patient Office Visit  Subjective:   Patient ID: Ashley Herman, female    DOB: 1996-01-11  Age: 27 y.o. MRN: [id number]  CC:  Chief Complaint  Patient presents with   Rash    Rash on face    HPI: Ashley Herman is a 27 y.o. female presenting on 07/26/2022 for Rash (Rash on face)  Rash    Has noticed a rash on her face around her eyebrows, forehead and around her eyes. She has been feeling some burning on her face especially when she tries to put cream on her face or even some steroid.   This has been going on for the last two and a half months.  She describes the sensation as a flaky scaly skin and she can peel the skin off at times.   She has been going to the nail salon and was getting her toe nails done, but now with a possible toe nail fungus       ROS: Negative unless specifically indicated above in HPI.   Relevant past medical history reviewed and updated as indicated.   Allergies and medications reviewed and updated.   Current Outpatient Medications:    fluticasone (FLONASE) 50 MCG/ACT nasal spray, Place 2 sprays into both nostrils daily., Disp: 16 g, Rfl: 6   metroNIDAZOLE (METROGEL) 1 % gel, Apply topically daily., Disp: 45 g, Rfl: 0   terbinafine (LAMISIL) 250 MG tablet, Take 1 tablet (250 mg total) by mouth daily for 14 days., Disp: 14 tablet, Rfl: 0   Vitamin D, Ergocalciferol, (DRISDOL) 1.25 MG (50000 UNIT) CAPS capsule, Take 1 capsule (50,000 Units total) by mouth every 7 (seven) days., Disp: 4 capsule, Rfl: 1  No Known Allergies  Objective:   BP 112/70 (BP Location: Left Arm)   Pulse 74   Temp 97.9 F (36.6 C) (Temporal)   Ht  (1.549 m)   Wt 141 lb 9.6 oz (64.2 kg)   SpO2 99%   BMI 26.76 kg/m    Physical Exam Feet:     Right foot:     Toenail Condition: Fungal disease present. Skin:    Comments: Annular few scaly lesions with hypopigmentation on right upper forehead (this one with appearance of central clearing) and left  lower forehead  Scant lesion around base of left lower eyelid region  Slight central forehead with scaly bumpy texture       Assessment & Plan:  Toenail fungus Assessment & Plan: With combination of suspected tinea on face as well as toenail fungus going to treat with terbinafine 250 mg one po qd for 14 days.     Orders: -     Terbinafine HCl; Take 1 tablet (250 mg total) by mouth daily for 14 days.  Dispense: 14 tablet; Refill: 0  Ringworm Assessment & Plan: Of face, rx metronidazole cream daily  F/u with derm if no improvement  Orders: -     metroNIDAZOLE; Apply topically daily.  Dispense: 45 g; Refill: 0 -     Terbinafine HCl; Take 1 tablet (250 mg total) by mouth daily for 14 days.  Dispense: 14 tablet; Refill: 0     Follow up plan: Return if symptoms worsen or fail to improve.  Mort Sawyers, FNP

## 2022-07-26 NOTE — Assessment & Plan Note (Signed)
Of face, rx metronidazole cream daily  F/u with derm if no improvement

## 2022-08-17 ENCOUNTER — Encounter: Payer: Self-pay | Admitting: Family

## 2022-08-17 DIAGNOSIS — B351 Tinea unguium: Secondary | ICD-10-CM

## 2022-08-17 DIAGNOSIS — B359 Dermatophytosis, unspecified: Secondary | ICD-10-CM

## 2022-08-24 ENCOUNTER — Ambulatory Visit (INDEPENDENT_AMBULATORY_CARE_PROVIDER_SITE_OTHER): Payer: Medicaid Other | Admitting: Dermatology

## 2022-08-24 ENCOUNTER — Ambulatory Visit: Payer: Medicaid Other | Admitting: Family

## 2022-08-24 ENCOUNTER — Encounter: Payer: Self-pay | Admitting: Dermatology

## 2022-08-24 VITALS — BP 118/73 | HR 86

## 2022-08-24 DIAGNOSIS — L219 Seborrheic dermatitis, unspecified: Secondary | ICD-10-CM | POA: Diagnosis not present

## 2022-08-24 MED ORDER — PIMECROLIMUS 1 % EX CREA: TOPICAL_CREAM | CUTANEOUS | 2 refills | Status: DC

## 2022-08-24 MED ORDER — FLUOCINOLONE ACETONIDE SCALP 0.01 % EX OIL: TOPICAL_OIL | CUTANEOUS | 4 refills | Status: DC

## 2022-08-24 MED ORDER — KETOCONAZOLE 2 % EX SHAM: MEDICATED_SHAMPOO | CUTANEOUS | 11 refills | Status: DC

## 2022-08-24 MED ORDER — KETOCONAZOLE 2 % EX CREA: TOPICAL_CREAM | CUTANEOUS | 2 refills | Status: DC

## 2022-08-24 NOTE — Progress Notes (Signed)
   New Patient Visit   Subjective  Ashley Herman is a 27 y.o. female who presents for the following: Rash. Dur: >2 months. Forehead, eyebrows. Itching. Was using Metrogel, did not help. No changes with soaps, lotions, make up prior to this rash. Flaky, dry. Pt works as an Public librarian.  Patient accompanied by mother.   The following portions of the chart were reviewed this encounter and updated as appropriate: medications, allergies, medical history  Review of Systems:  No other skin or systemic complaints except as noted in HPI or Assessment and Plan.  Objective  Well appearing patient in no apparent distress; mood and affect are within normal limits.  A focused examination was performed of the following areas: face  Relevant exam findings are noted in the Assessment and Plan.    Assessment & Plan     SEBORRHEIC DERMATITIS Exam: hypopigmented scaly macules and patches at forehead, nose, cheeks  Chronic and persistent condition with duration or expected duration over one year. Condition is bothersome/symptomatic for patient. Currently flared.   Seborrheic Dermatitis is a chronic persistent rash characterized by pinkness and scaling most commonly of the mid face but also can occur on the scalp (dandruff), ears; mid chest, mid back and groin.  It tends to be exacerbated by stress and cooler weather.  People who have neurologic disease may experience new onset or exacerbation of existing seborrheic dermatitis.  The condition is not curable but treatable and can be controlled.  Treatment Plan: Start Ketoconazole 2% cream twice daily to affected areas on face.  Start Elidel (Pimecrolimus) cream twice daily to affected areas on face.  Use 2-3 weeks or until clear then use as needed. Can decrease to using each cream once a day.  Start Ketoconazole 2% shampoo lather on scalp weekly, leave on 8-10 minutes, rinse well.  Derma-Smoothe (Fluocinolone) scalp oil Apply once or twice daily to  affected areas on scalp as needed for itching.   Return for Seborrheic Dermatitis Follow Up in 2-3 months.  I, Lawson Radar, CMA, am acting as scribe for Willeen Niece, MD.   Documentation: I have reviewed the above documentation for accuracy and completeness, and I agree with the above.  Willeen Niece, MD

## 2022-08-24 NOTE — Patient Instructions (Addendum)
Seborrheic Dermatitis is a chronic persistent rash characterized by pinkness and scaling most commonly of the mid face but also can occur on the scalp (dandruff), ears; mid chest, mid back and groin.  It tends to be exacerbated by stress and cooler weather.  People who have neurologic disease may experience new onset or exacerbation of existing seborrheic dermatitis.  The condition is not curable but treatable and can be controlled.  Treatment Plan:  Face Start Ketoconazole 2% cream twice daily to affected areas on face. (Can also use on toe fungus) Start Elidel (Pimecrolimus) cream twice daily to affected areas on face.  Use 2-3 weeks or until clear then use as needed. Can decrease to using each cream once a day.   Scalp Start Ketoconazole 2% shampoo lather on scalp, leave on 8-10 minutes, rinse well.  Derma-Smoothe (Fluocinolone) scalp oil Apply once or twice daily to affected areas on scalp as needed for itching.    Topical steroids (such as triamcinolone, fluocinolone, fluocinonide, mometasone, clobetasol, halobetasol, betamethasone, hydrocortisone) can cause thinning and lightening of the skin if they are used for too long in the same area. Your physician has selected the right strength medicine for your problem and area affected on the body. Please use your medication only as directed by your physician to prevent side effects.    Recommend daily broad spectrum sunscreen SPF 30+ to sun-exposed areas, reapply every 2 hours as needed. Call for new or changing lesions.  Staying in the shade or wearing long sleeves, sun glasses (UVA+UVB protection) and wide brim hats (4-inch brim around the entire circumference of the hat) are also recommended for sun protection.     Due to recent changes in healthcare laws, you may see results of your pathology and/or laboratory studies on MyChart before the doctors have had a chance to review them. We understand that in some cases there may be results that  are confusing or concerning to you. Please understand that not all results are received at the same time and often the doctors may need to interpret multiple results in order to provide you with the best plan of care or course of treatment. Therefore, we ask that you please give [place] 2 business days to thoroughly review all your results before contacting the office for clarification. Should we see a critical lab result, you will be contacted sooner.   If You Need Anything After Your Visit  If you have any questions or concerns for your doctor, please call our main line at [phone] and press option 4 to reach your doctor's medical assistant. If no one answers, please leave a voicemail as directed and we will return your call as soon as possible. Messages left after 4 pm will be answered the following business day.   You may also send [place] a message via MyChart. We typically respond to MyChart messages within 1-2 business days.  For prescription refills, please ask your pharmacy to contact our office. Our fax number is [phone].  If you have an urgent issue when the clinic is closed that cannot wait until the next business day, you can page your doctor at the number below.    Please note that while we do our best to be available for urgent issues outside of office hours, we are not available 24/7.   If you have an urgent issue and are unable to reach [place], you may choose to seek medical care at your doctor's office, retail clinic, urgent care center, or emergency room.  If you have a medical emergency, please immediately call 911 or go to the emergency department.  Pager Numbers  - Dr. Gwen Pounds: [phone]  - Dr. Neale Burly: [phone]  - Dr. Roseanne Reno: [phone]  In the event of inclement weather, please call our main line at [phone] for an update on the status of any delays or closures.  Dermatology Medication Tips: Please keep the boxes that topical medications come in in order  to help keep track of the instructions about where and how to use these. Pharmacies typically print the medication instructions only on the boxes and not directly on the medication tubes.   If your medication is too expensive, please contact our office at [phone] option 4 or send [place] a message through MyChart.   We are unable to tell what your co-pay for medications will be in advance as this is different depending on your insurance coverage. However, we may be able to find a substitute medication at lower cost or fill out paperwork to get insurance to cover a needed medication.   If a prior authorization is required to get your medication covered by your insurance company, please allow [place] 1-2 business days to complete this process.  Drug prices often vary depending on where the prescription is filled and some pharmacies may offer cheaper prices.  The website www.goodrx.com contains coupons for medications through different pharmacies. The prices here do not account for what the cost may be with help from insurance (it may be cheaper with your insurance), but the website can give you the price if you did not use any insurance.  - You can print the associated coupon and take it with your prescription to the pharmacy.  - You may also stop by our office during regular business hours and pick up a GoodRx coupon card.  - If you need your prescription sent electronically to a different pharmacy, notify our office through [facility] or by phone at [phone] option 4.     Si Usted Necesita Algo Despus de Su Visita  Tambin puede enviarnos un mensaje a travs de Clinical cytogeneticist. Por lo general respondemos a los mensajes de MyChart en el transcurso de 1 a 2 das hbiles.  Para renovar recetas, por favor pida a su farmacia que se ponga en contacto con nuestra oficina. Annie Sable de fax es [place] [phone].  Si tiene un asunto urgente cuando la clnica est cerrada y que no puede esperar  hasta el siguiente da hbil, puede llamar/localizar a su doctor(a) al nmero que aparece a continuacin.   Por favor, tenga en cuenta que aunque hacemos todo lo posible para estar disponibles para asuntos urgentes fuera del horario de [place], no estamos disponibles las 24 horas del da, los 7 [address]  [address] de la [place].   Si tiene un problema urgente y no puede comunicarse con nosotros, puede optar por buscar atencin mdica  en el consultorio de su doctor(a), en una clnica privada, en un centro de atencin urgente o en una sala de emergencias.  Si tiene Engineer, drilling, por favor llame inmediatamente al 911 o vaya a la sala de emergencias.  Nmeros de bper  - Dr. Gwen Pounds: [phone]  - Dra. Moye: [phone]  - Dra. Roseanne Reno: [phone]  En caso de inclemencias del [place], por favor llame a Lacy Duverney principal al [phone] para una actualizacin sobre el [place] de cualquier retraso o cierre.  Consejos para la medicacin en dermatologa: Por favor, guarde las cajas en las que vienen los medicamentos de  uso tpico para ayudarle a seguir las [employer] dnde y cmo usarlos. Las farmacias generalmente imprimen las instrucciones del medicamento slo en las cajas y no directamente en los tubos del [place].   Si su medicamento es muy caro, por favor, pngase en contacto con Rolm Gala llamando al [phone] y presione la opcin 4 o envenos un mensaje a travs de Clinical cytogeneticist.   No podemos decirle cul ser su copago por los medicamentos por adelantado ya que esto es diferente dependiendo de la cobertura de su seguro. Sin embargo, es posible que podamos encontrar un medicamento sustituto a Audiological scientist un formulario para que el seguro cubra el medicamento que se considera necesario.   Si se requiere una autorizacin previa para que su compaa de seguros [place] su medicamento, por favor permtanos de 1 a 2 das hbiles para completar [address]  Los precios  de los medicamentos varan con frecuencia dependiendo del Environmental consultant de dnde se surte la receta y alguna farmacias pueden ofrecer precios ms baratos.  El sitio web www.goodrx.com tiene cupones para medicamentos de Health and safety inspector. Los precios aqu no tienen en cuenta lo que podra costar con la ayuda del seguro (puede ser ms barato con su seguro), pero el sitio web puede darle el precio si no utiliz Tourist information centre manager.  - Puede imprimir el cupn correspondiente y llevarlo con su receta a la farmacia.  - Tambin puede pasar por nuestra oficina durante el horario de atencin regular y Education officer, museum una tarjeta de cupones de GoodRx.  - Si necesita que su receta se enve electrnicamente a una farmacia diferente, informe a nuestra oficina a travs de MyChart de [place] o por telfono llamando al [phone] y presione la opcin 4.

## 2022-08-30 ENCOUNTER — Encounter: Payer: Self-pay | Admitting: Dermatology

## 2022-08-30 NOTE — Telephone Encounter (Signed)
PA submitted to Medicaid today. aw

## 2022-10-20 ENCOUNTER — Other Ambulatory Visit (HOSPITAL_COMMUNITY)
Admission: RE | Admit: 2022-10-20 | Discharge: 2022-10-20 | Disposition: A | Discharge status: Home / Self Care | Payer: Medicaid Other | Source: Ambulatory Visit | Attending: Obstetrics & Gynecology | Admitting: Obstetrics & Gynecology

## 2022-10-20 ENCOUNTER — Ambulatory Visit: Payer: Medicaid Other | Admitting: Obstetrics & Gynecology

## 2022-10-20 ENCOUNTER — Encounter: Payer: Self-pay | Admitting: Obstetrics & Gynecology

## 2022-10-20 VITALS — BP 113/69 | HR 84 | Ht 61.0 in | Wt 143.0 lb

## 2022-10-20 DIAGNOSIS — N898 Other specified noninflammatory disorders of vagina: Secondary | ICD-10-CM | POA: Diagnosis present

## 2022-10-20 DIAGNOSIS — R239 Unspecified skin changes: Secondary | ICD-10-CM | POA: Diagnosis not present

## 2022-10-20 DIAGNOSIS — Z124 Encounter for screening for malignant neoplasm of cervix: Secondary | ICD-10-CM | POA: Insufficient documentation

## 2022-10-20 DIAGNOSIS — B9689 Other specified bacterial agents as the cause of diseases classified elsewhere: Secondary | ICD-10-CM

## 2022-10-20 DIAGNOSIS — R635 Abnormal weight gain: Secondary | ICD-10-CM

## 2022-10-20 DIAGNOSIS — N760 Acute vaginitis: Secondary | ICD-10-CM

## 2022-10-20 NOTE — Progress Notes (Signed)
GYNECOLOGY OFFICE VISIT NOTE  History:   Ashley Herman is a 27 y.o. F here today for discussion about weight gain of 10 lbs and skin changes in the last two months.  Was seen by Dermatologist who told her this could be hormonal and her IUD could be the culprit.  Patient has the [facility] in place, this was placed August 2022.  She is currently amenorrheic.  Also wants to be evaluated for abnormal vaginal discharge for a few days. She denies any abnormal vaginal bleeding, pelvic pain or other concerns.    Past Medical History:  Diagnosis Date   Anemia    H/O prior ablation treatment 05/26/2020   [place]    Past Surgical History:  Procedure Laterality Date   ABLATION     BUNIONECTOMY Right     The following portions of the patient's history were reviewed and updated as appropriate: allergies, current medications, past family history, past medical history, past social history, past surgical history and problem list.   Health Maintenance:  Normal pap in [place] around 2022 (no report).   Review of Systems:  Pertinent items noted in HPI and remainder of comprehensive ROS otherwise negative.  Physical Exam:  BP 113/69   Pulse 84   Ht 5\' 1"  (1.549 m)   Wt 143 lb (64.9 kg)   BMI 27.02 kg/m  CONSTITUTIONAL: Well-developed, well-nourished female in no acute distress.  HEENT:  Normocephalic, atraumatic. External right and left ear normal. No scleral icterus.  NECK: Normal range of motion, supple, no masses noted on observation SKIN: Some acne noted on face. Not diaphoretic. No erythema. No pallor. MUSCULOSKELETAL: Normal range of motion. No edema noted. NEUROLOGIC: Alert and oriented to person, place, and time. Normal muscle tone coordination. No cranial nerve deficit noted. PSYCHIATRIC: Normal mood and affect. Normal behavior. Normal judgment and thought content. CARDIOVASCULAR: Normal heart rate noted RESPIRATORY: Effort and breath sounds normal, no problems with respiration  noted ABDOMEN: No masses noted. No other overt distention noted.   PELVIC: Normal appearing external genitalia; normal urethral meatus; normal appearing vaginal mucosa and cervix.  Kyleena strings visualized. Thin, white discharge noted, testing sample obtained.  Pap smear obtained.  No uterine or adnexal tenderness. Performed in the presence of a chaperone     Assessment and Plan:     1. Weight gain 2. Recent skin changes Discussed with patient that it is unlikely that Rutha Bouchard is the culprit as it has been in place for two years.  And this is the lowest hormonal level IUD; the current amount amount of hormone is also much less that it was at time of insertion in 2022.  However, discussed possible other etiologies.  Will check other hormonal levels, will follow up results and manage accordingly..  She can follow up with Dermatologist for management of skin changes.  If she decides to remove the the IUD, we will discuss other options as other alternatives such as pills, patch etc have more systemic levels of hormones and are more associated with weight and skin changes.  However, is she has elevated testosterone level, she may actually need more hormonal suppression with OCPs as the [facility] may not be sufficient. - TSH Rfx on Abnormal to Free T4 - Testosterone,Free and Total - Estrogens, Total  3. Vaginal discharge Proper vulvar hygiene emphasized: discussed avoidance of perfumed soaps, detergents, lotions and any type of douches; in addition to wearing cotton underwear and no underwear at night.  Also recommended cleaning front to back,  voiding and cleaning up after intercourse.  - Cervicovaginal ancillary only done, will follow up results and manage accordingly.  4. Pap smear for cervical cancer screening - Cytology - PAP( [place]) done, will follow up results and manage accordingly.  Routine preventative health maintenance measures emphasized. Please refer to After Visit Summary for other  counseling recommendations.   Return for any gynecologic concerns.    I spent 30 minutes dedicated to the care of this patient including pre-visit review of records, face to face time with the patient discussing her conditions and treatments and post visit orders.    Jaynie Collins, MD, FACOG Obstetrician & Gynecologist, [facility] for [employer], [facility]

## 2022-10-24 LAB — CERVICOVAGINAL ANCILLARY ONLY
Bacterial Vaginitis (gardnerella): POSITIVE — AB
Candida Glabrata: NEGATIVE
Candida Vaginitis: NEGATIVE
Comment: NEGATIVE
Comment: NEGATIVE
Comment: NEGATIVE
Comment: NEGATIVE
Trichomonas: NEGATIVE

## 2022-10-24 MED ORDER — METRONIDAZOLE 500 MG PO TABS: 500.0000 mg | ORAL_TABLET | Freq: Two times a day (BID) | ORAL | 0 refills | Status: AC

## 2022-10-24 NOTE — Addendum Note (Signed)
Addended by: Jaynie Collins A on: 10/24/2022 12:45 PM   Modules accepted: Orders

## 2022-10-25 LAB — TSH RFX ON ABNORMAL TO FREE T4: TSH: 1.01 u[IU]/mL (ref 0.450–4.500)

## 2022-10-25 LAB — ESTROGENS, TOTAL: Estrogen: 142 pg/mL

## 2022-10-25 LAB — TESTOSTERONE,FREE AND TOTAL
Testosterone, Free: 0.7 pg/mL (ref 0.0–4.2)
Testosterone: 33 ng/dL (ref 13–71)

## 2022-10-28 LAB — CYTOLOGY - PAP
Chlamydia: NEGATIVE
Comment: NEGATIVE
Comment: NEGATIVE
Comment: NEGATIVE
Comment: NORMAL
Diagnosis: UNDETERMINED — AB
High risk HPV: POSITIVE — AB
Neisseria Gonorrhea: NEGATIVE
Trichomonas: NEGATIVE

## 2022-10-30 ENCOUNTER — Encounter: Payer: Self-pay | Admitting: Obstetrics & Gynecology

## 2022-10-30 DIAGNOSIS — R87610 Atypical squamous cells of undetermined significance on cytologic smear of cervix (ASC-US): Secondary | ICD-10-CM | POA: Insufficient documentation

## 2022-11-22 ENCOUNTER — Ambulatory Visit: Payer: Medicaid Other | Admitting: Dermatology

## 2022-12-07 ENCOUNTER — Ambulatory Visit: Payer: Medicaid Other | Admitting: Obstetrics & Gynecology

## 2022-12-07 ENCOUNTER — Encounter: Payer: Self-pay | Admitting: Obstetrics & Gynecology

## 2022-12-07 ENCOUNTER — Other Ambulatory Visit (HOSPITAL_COMMUNITY)
Admission: RE | Admit: 2022-12-07 | Discharge: 2022-12-07 | Disposition: A | Discharge status: Home / Self Care | Payer: Medicaid Other | Source: Ambulatory Visit | Attending: Obstetrics & Gynecology | Admitting: Obstetrics & Gynecology

## 2022-12-07 VITALS — BP 117/83 | HR 78 | Wt 142.6 lb

## 2022-12-07 DIAGNOSIS — R87810 Cervical high risk human papillomavirus (HPV) DNA test positive: Secondary | ICD-10-CM | POA: Diagnosis present

## 2022-12-07 DIAGNOSIS — R87610 Atypical squamous cells of undetermined significance on cytologic smear of cervix (ASC-US): Secondary | ICD-10-CM | POA: Insufficient documentation

## 2022-12-07 DIAGNOSIS — Z01818 Encounter for other preprocedural examination: Secondary | ICD-10-CM | POA: Diagnosis not present

## 2022-12-07 LAB — POCT URINE PREGNANCY: Preg Test, Ur: NEGATIVE

## 2022-12-07 NOTE — Progress Notes (Signed)
    GYNECOLOGY OFFICE COLPOSCOPY PROCEDURE NOTE  27 y.o. G0P0000 here for colposcopy for ASCUS with POSITIVE high risk HPV pap smear on 10/20/22. Discussed role for HPV in cervical dysplasia, need for surveillance.  Patient gave informed written consent, time out was performed.  Placed in lithotomy position. Cervix viewed with speculum and colposcope after application of acetic acid.   Colposcopy adequate? Yes No acetowhite lesion noted.  ECC specimen obtained,  the specimen was labeled and sent to pathology.  Chaperone was present during entire procedure.  Patient was given post procedure instructions.  Will follow up pathology and manage accordingly; patient will be contacted with results and recommendations.  Routine preventative health maintenance measures emphasized.    Jaynie Collins, MD, FACOG Obstetrician & Gynecologist, [facility] for [employer], [facility]

## 2022-12-07 NOTE — Patient Instructions (Signed)
COLPOSCOPY POST-PROCEDURE INSTRUCTIONS  You may take Ibuprofen, Aleve or Tylenol for cramping if needed.  If Monsel's solution was used, you will have a black discharge.  Light bleeding is normal.  If bleeding is heavier than your period, please call.  Put nothing in your vagina until the bleeding or discharge stops (usually 2 or3 days).  We will call you within one week with biopsy results  

## 2022-12-15 LAB — SURGICAL PATHOLOGY

## 2023-01-03 ENCOUNTER — Ambulatory Visit: Payer: Medicaid Other | Admitting: Family

## 2023-02-06 ENCOUNTER — Ambulatory Visit: Payer: Medicaid Other | Admitting: Family

## 2023-02-07 ENCOUNTER — Ambulatory Visit (INDEPENDENT_AMBULATORY_CARE_PROVIDER_SITE_OTHER): Payer: Medicaid Other | Admitting: Dermatology

## 2023-02-07 ENCOUNTER — Encounter: Payer: Self-pay | Admitting: Dermatology

## 2023-02-07 DIAGNOSIS — L209 Atopic dermatitis, unspecified: Secondary | ICD-10-CM

## 2023-02-07 DIAGNOSIS — L219 Seborrheic dermatitis, unspecified: Secondary | ICD-10-CM

## 2023-02-07 DIAGNOSIS — L309 Dermatitis, unspecified: Secondary | ICD-10-CM

## 2023-02-07 DIAGNOSIS — Z7189 Other specified counseling: Secondary | ICD-10-CM

## 2023-02-07 MED ORDER — ZORYVE 0.3 % EX CREA: TOPICAL_CREAM | CUTANEOUS | 2 refills | Status: AC

## 2023-02-07 MED ORDER — FLUCONAZOLE 150 MG PO TABS
150.0000 mg | ORAL_TABLET | ORAL | 0 refills | Status: AC
Start: 2023-02-07 — End: 2023-02-21

## 2023-02-07 NOTE — Progress Notes (Signed)
   Follow Up Visit   Subjective  Ashley Herman is a 27 y.o. female who presents for the following: Rash on forehead and below eyebrows. Was scaly and burning. Spreading. Worse x1 month. Ketoconazole causes pimple like bumps on forehead. Pimecrolimus causes burning.  Dx with seborrheic dermatitis by Dr. Roseanne Reno 08/24/2022. Has been using OTC HC cream, helps with the itching. Uses an entire tube quickly. Has used Metronidazole 1% gel in the past, did not help.   Rash on right collar bone area of chest.    The following portions of the chart were reviewed this encounter and updated as appropriate: medications, allergies, medical history  Review of Systems:  No other skin or systemic complaints except as noted in HPI or Assessment and Plan.  Objective  Well appearing patient in no apparent distress; mood and affect are within normal limits.  A focused examination was performed of the following areas: Face   Relevant exam findings are noted in the Assessment and Plan.    Assessment & Plan     SEBORRHEIC DERMATITIS Exam: geometric hypopigmented patch at right and central forehead. Perinasal hypopigmented subtly scaly patches of bilateral perinasal, focally on bilateral cheeks  Failed pimecrolimus (burning) hydrocortisone ketoconazole (acne)  Chronic and persistent condition with duration or expected duration over one year. Condition is symptomatic/ bothersome to patient. Not currently at goal.   Seborrheic Dermatitis is a chronic persistent rash characterized by pinkness and scaling most commonly of the mid face but also can occur on the scalp (dandruff), ears; mid chest, mid back and groin.  It tends to be exacerbated by stress and cooler weather.  People who have neurologic disease may experience new onset or exacerbation of existing seborrheic dermatitis.  The condition is not curable but treatable and can be controlled.  Treatment Plan: Start Zoryve 0.3% cream apply to affected  areas daily Take Fluconazole 150 mg orally every other day for 14 days.  Stop Hydrocortisone cream.   Eczematous dermatitis  Chronic and persistent condition with duration or expected duration over one year. Condition is symptomatic/ bothersome to patient. Not currently at goal.  Exam: Scaly hyperpigmented patches on periorbital and right clavicular skin  Treatment Plan: Zoryve 0.3% cream apply to affected areas daily  Return in about 2 weeks (around 02/21/2023) for Seborrheic Dermatitis Follow Up.  I, Lawson Radar, CMA, am acting as scribe for Elie Goody, MD.   Documentation: I have reviewed the above documentation for accuracy and completeness, and I agree with the above.  Elie Goody, MD

## 2023-02-07 NOTE — Patient Instructions (Addendum)
Zoryve 0.3% cream apply to affected areas once daily.  Take Fluconazole 150 mg orally every other day for 14 days.   Stop Hydrocortisone cream.    Your prescription was sent to [facility] in [place]. A representative from [facility] will contact you within 3 business hours to verify your address and insurance information to schedule a free delivery. If for any reason you do not receive a phone call from them, please reach out to them. Their phone number is [phone] and their hours are Monday-Friday 9:00 am-5:00 pm.      Due to recent changes in healthcare laws, you may see results of your pathology and/or laboratory studies on MyChart before the doctors have had a chance to review them. We understand that in some cases there may be results that are confusing or concerning to you. Please understand that not all results are received at the same time and often the doctors may need to interpret multiple results in order to provide you with the best plan of care or course of treatment. Therefore, we ask that you please give [place] 2 business days to thoroughly review all your results before contacting the office for clarification. Should we see a critical lab result, you will be contacted sooner.   If You Need Anything After Your Visit  If you have any questions or concerns for your doctor, please call our main line at [phone] and press option 4 to reach your doctor's medical assistant. If no one answers, please leave a voicemail as directed and we will return your call as soon as possible. Messages left after 4 pm will be answered the following business day.   You may also send [place] a message via MyChart. We typically respond to MyChart messages within 1-2 business days.  For prescription refills, please ask your pharmacy to contact our office. Our fax number is [phone].  If you have an urgent issue when the clinic is closed that cannot wait until the next business day, you can  page your doctor at the number below.    Please note that while we do our best to be available for urgent issues outside of office hours, we are not available 24/7.   If you have an urgent issue and are unable to reach [place], you may choose to seek medical care at your doctor's office, retail clinic, urgent care center, or emergency room.  If you have a medical emergency, please immediately call 911 or go to the emergency department.  Pager Numbers  - Dr. Gwen Pounds: [phone]  - Dr. Roseanne Reno: [phone]  - Dr. Katrinka Blazing: [phone]   In the event of inclement weather, please call our main line at [phone] for an update on the status of any delays or closures.  Dermatology Medication Tips: Please keep the boxes that topical medications come in in order to help keep track of the instructions about where and how to use these. Pharmacies typically print the medication instructions only on the boxes and not directly on the medication tubes.   If your medication is too expensive, please contact our office at [phone] option 4 or send [place] a message through MyChart.   We are unable to tell what your co-pay for medications will be in advance as this is different depending on your insurance coverage. However, we may be able to find a substitute medication at lower cost or fill out paperwork to get insurance to cover a needed medication.   If a prior authorization is required  to get your medication covered by your insurance company, please allow [place] 1-2 business days to complete this process.  Drug prices often vary depending on where the prescription is filled and some pharmacies may offer cheaper prices.  The website www.goodrx.com contains coupons for medications through different pharmacies. The prices here do not account for what the cost may be with help from insurance (it may be cheaper with your insurance), but the website can give you the price if you did not use any insurance.  - You  can print the associated coupon and take it with your prescription to the pharmacy.  - You may also stop by our office during regular business hours and pick up a GoodRx coupon card.  - If you need your prescription sent electronically to a different pharmacy, notify our office through [facility] or by phone at [phone] option 4.     Si Usted Necesita Algo Despus de Su Visita  Tambin puede enviarnos un mensaje a travs de Clinical cytogeneticist. Por lo general respondemos a los mensajes de MyChart en el transcurso de 1 a 2 das hbiles.  Para renovar recetas, por favor pida a su farmacia que se ponga en contacto con nuestra oficina. Annie Sable de fax es [place] [phone].  Si tiene un asunto urgente cuando la clnica est cerrada y que no puede esperar hasta el siguiente da hbil, puede llamar/localizar a su doctor(a) al nmero que aparece a continuacin.   Por favor, tenga en cuenta que aunque hacemos todo lo posible para estar disponibles para asuntos urgentes fuera del horario de [place], no estamos disponibles las 24 horas del da, los 7 [address]  [address] de la [place].   Si tiene un problema urgente y no puede comunicarse con nosotros, puede optar por buscar atencin mdica  en el consultorio de su doctor(a), en una clnica privada, en un centro de atencin urgente o en una sala de emergencias.  Si tiene Engineer, drilling, por favor llame inmediatamente al 911 o vaya a la sala de emergencias.  Nmeros de bper  - Dr. Gwen Pounds: [phone]  - Dra. Roseanne Reno: [phone]  - Dr. Katrinka Blazing: [phone]   En caso de inclemencias del tiempo, por favor llame a Lacy Duverney principal al [phone] para una actualizacin sobre el [place] de cualquier retraso o cierre.  Consejos para la medicacin en dermatologa: Por favor, guarde las cajas en las que vienen los medicamentos de uso tpico para ayudarle a seguir las instrucciones sobre dnde y cmo usarlos. Las farmacias generalmente imprimen las  instrucciones del medicamento slo en las cajas y no directamente en los tubos del [place].   Si su medicamento es muy caro, por favor, pngase en contacto con Rolm Gala llamando al [phone] y presione la opcin 4 o envenos un mensaje a travs de Clinical cytogeneticist.   No podemos decirle cul ser su copago por los medicamentos por adelantado ya que esto es diferente dependiendo de la cobertura de su seguro. Sin embargo, es posible que podamos encontrar un medicamento sustituto a Audiological scientist un formulario para que el seguro cubra el medicamento que se considera necesario.   Si se requiere una autorizacin previa para que su compaa de seguros [place] su medicamento, por favor permtanos de 1 a 2 das hbiles para completar [address]  Los precios de los medicamentos varan con frecuencia dependiendo del Environmental consultant de dnde se surte la receta y alguna farmacias pueden ofrecer precios ms baratos.  El sitio web www.goodrx.com tiene cupones para medicamentos de  diferentes farmacias. Los precios aqu no tienen en cuenta lo que podra costar con la ayuda del seguro (puede ser ms barato con su seguro), pero el sitio web puede darle el precio si no utiliz Tourist information centre manager.  - Puede imprimir el cupn correspondiente y llevarlo con su receta a la farmacia.  - Tambin puede pasar por nuestra oficina durante el horario de atencin regular y Education officer, museum una tarjeta de cupones de GoodRx.  - Si necesita que su receta se enve electrnicamente a una farmacia diferente, informe a nuestra oficina a travs de MyChart de [place] o por telfono llamando al [phone] y presione la opcin 4.

## 2023-02-08 ENCOUNTER — Telehealth: Payer: Self-pay

## 2023-02-08 NOTE — Telephone Encounter (Signed)
Zoryve denied until patient has tried and failed Calcipotriene Cream.

## 2023-02-15 ENCOUNTER — Telehealth: Payer: Self-pay | Admitting: Cardiology

## 2023-02-15 NOTE — Telephone Encounter (Signed)
   Pt c/o of Chest Pain: STAT if active CP, including tightness, pressure, jaw pain, radiating pain to shoulder/upper arm/back, CP unrelieved by Nitro. Symptoms reported of SOB, nausea, vomiting, sweating.  1. Are you having CP right now? Chest discomfort at this time - sharp pain   2. Are you experiencing any other symptoms (ex. SOB, nausea, vomiting, sweating)?    3. Is your CP continuous or coming and going? Comes and goes   4. Have you taken Nitroglycerin? no   5. How long have you been experiencing CP?  About 2 weeks ago   6. If NO CP at time of call then end call with telling Pt to call back or call 911 if Chest pain returns prior to return call from triage team.

## 2023-02-15 NOTE — Telephone Encounter (Signed)
Spoke to patient and she stated that for the last 2 weeks she has been having off and on sharp stabbing pains on the left side of her chest. Patient stated that it comes and goes at both rest and exertion. Patient denies SOB, nausea, vomiting, fever, chills, and excessive sweating. Patient has appointment scheduled for 02/16/23 but instructed to present to the nearest ED or call 911 if pain returns with shortness of breath that is unrelieved with rest, radiates to jaw/arm, dizziness/lightheaded, and/or slurred speech. Patient understood with read back

## 2023-02-16 ENCOUNTER — Encounter: Payer: Self-pay | Admitting: Cardiology

## 2023-02-16 ENCOUNTER — Ambulatory Visit: Payer: Medicaid Other | Attending: Cardiology | Admitting: Cardiology

## 2023-02-16 VITALS — BP 112/82 | HR 84 | Ht 61.0 in | Wt 140.2 lb

## 2023-02-16 DIAGNOSIS — I471 Supraventricular tachycardia, unspecified: Secondary | ICD-10-CM | POA: Diagnosis not present

## 2023-02-16 DIAGNOSIS — R072 Precordial pain: Secondary | ICD-10-CM

## 2023-02-16 NOTE — Patient Instructions (Signed)

## 2023-02-16 NOTE — Telephone Encounter (Signed)
With her having a state insurance, she does not qualify for the non covered copay cards.

## 2023-02-16 NOTE — Progress Notes (Signed)
Cardiology Office Note:    Date:  02/16/2023   ID:  Ashley Herman, DOB 20-Oct-1995, MRN [id number]  PCP:  Mort Sawyers, FNP   [facility] Providers Cardiologist:  Debbe Odea, MD     Referring MD: Mort Sawyers, FNP   Chief Complaint  Patient presents with   Acute Visit    Concerns of intermittent left side sharp chest pain for 2 weeks.      History of Present Illness:    Ashley Herman is a 27 y.o. 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  Had left-sided chest pain while at work yesterday which she describes as sharp.  Lifting her left arm makes pain worse.  Pushing on her chest also made pain worse.  Had an episode of shortness of breath while walking about 2 weeks ago.  Prior notes/testing Echo 05/2022 EF 60 to 65% Cardiac monitor 05/2022 no significant arrhythmias.   Past Medical History:  Diagnosis Date   Anemia    H/O prior ablation treatment 05/26/2020   [place]    Past Surgical History:  Procedure Laterality Date   ABLATION     BUNIONECTOMY Right     Current Medications: Current Meds  Medication Sig   fluconazole (DIFLUCAN) 150 MG tablet Take 1 tablet (150 mg total) by mouth every other day for 14 days.   Fluocinolone Acetonide Scalp (DERMA-SMOOTHE/FS SCALP) 0.01 % OIL Apply once or twice daily to affected areas on scalp as needed for itching.   fluticasone (FLONASE) 50 MCG/ACT nasal spray Place 2 sprays into both nostrils daily.   ketoconazole (NIZORAL) 2 % cream Apply twice daily to affected areas on face.   ketoconazole (NIZORAL) 2 % shampoo lather on scalp, leave on 8-10 minutes, rinse well.   metroNIDAZOLE (METROGEL) 1 % gel Apply topically daily.   pimecrolimus (ELIDEL) 1 % cream Apply twice daily to affected areas on face.    Roflumilast (ZORYVE) 0.3 % CREA Apply once daily to affected areas on face     Allergies:   Patient has no known allergies.   Social History   Socioeconomic History   Marital status: Single    Spouse name: Not on file   Number of children: Not on file   Years of education: Not on file   Highest education level: Some college, no degree  Occupational History   Occupation: daycare  Tobacco Use   Smoking status: Never    Passive exposure: Never   Smokeless tobacco: Never  Vaping Use   Vaping status: Never Used  Substance and Sexual Activity   Alcohol use: No    Comment: occasional   Drug use: No   Sexual activity: Yes    Partners: Male    Birth control/protection: I.U.D.  Other Topics Concern   Not on file  Social History Narrative   Dating a boyfriend since September    Social Determinants of Health   Financial Resource Strain: Patient Declined (07/22/2022)   Overall Financial Resource Strain (CARDIA)    Difficulty of Paying Living Expenses: Patient declined  Food Insecurity: Patient Declined (07/22/2022)   Hunger Vital Sign    Worried About Running Out of Food in the Last Year: Patient declined    Ran Out of Food in the Last Year: Patient declined  Transportation Needs: No Transportation Needs (07/22/2022)   PRAPARE - Administrator, Civil Service (Medical): No    Lack of Transportation (Non-Medical): No  Physical Activity: Insufficiently Active (07/22/2022)   Exercise Vital Sign    Days of Exercise per Week: 1 day    Minutes of Exercise per Session: 20 min  Stress: No Stress Concern Present (07/22/2022)   [employer] of Occupational Health - Occupational Stress Questionnaire    Feeling of Stress : Not at all  Social Connections: Moderately Isolated (07/22/2022)   Social Connection and Isolation Panel [NHANES]    Frequency of Communication with Friends and Family: More than three times a week    Frequency of Social Gatherings with Friends and Family:  Three times a week    Attends Religious Services: 1 to 4 times per year    Active Member of Clubs or Organizations: No    Attends Engineer, structural: Not on file    Marital Status: Never married     Family History: The patient's family history includes Clotting disorder in her father; Healthy in her mother; Leukemia in her father; Parkinson's disease in her father.  ROS:   Please see the history of present illness.     All other systems reviewed and are negative.  EKGs/Labs/Other Studies Reviewed:    The following studies were reviewed today:   EKG Interpretation Date/Time:  Thursday February 16 2023 10:19:04 EST Ventricular Rate:  84 PR Interval:  154 QRS Duration:  72 QT Interval:  348 QTC Calculation: 411 R Axis:   77  Text Interpretation: Normal sinus rhythm Normal ECG Confirmed by Debbe Odea ([id number]) on 02/16/2023 10:26:49 AM    Recent Labs: 06/30/2022: BUN 10; Creatinine, Ser 0.73; Hemoglobin 13.5; Platelets 399.0; Potassium 3.9; Sodium 138 10/21/2022: TSH 1.010  Recent Lipid Panel    Component Value Date/Time   CHOL 168 06/30/2022 0926   TRIG 56.0 06/30/2022 0926   HDL 73.00 06/30/2022 0926   CHOLHDL 2 06/30/2022 0926   VLDL 11.2 06/30/2022 0926   LDLCALC 83 06/30/2022 0926     Risk Assessment/Calculations:             Physical Exam:    VS:  BP 112/82 (BP Location: Left Arm, Patient Position: Sitting, Cuff Size: Normal)   Pulse 84   Ht 5\' 1"  (1.549 m)   Wt 140 lb 3.2 oz (63.6 kg)   SpO2 99%   BMI 26.49 kg/m     Wt Readings from Last 3 Encounters:  02/16/23 140 lb 3.2 oz (63.6 kg)  12/07/22 142 lb 9.6 oz (64.7 kg)  10/20/22 143 lb (64.9 kg)     GEN:  Well nourished, well developed in no acute distress HEENT: Normal NECK: No JVD; No carotid bruits CARDIAC: RRR, no murmurs, rubs, gallops RESPIRATORY:  Clear to auscultation without rales, wheezing or rhonchi  ABDOMEN: Soft, non-tender, non-distended MUSCULOSKELETAL:  No edema;  left chest tenderness on palpation. SKIN: Warm and dry NEUROLOGIC:  Alert and oriented x 3 PSYCHIATRIC:  Normal affect   ASSESSMENT:    1. Precordial pain   2. SVT (supraventricular tachycardia) (HCC)    PLAN:    In order of problems listed above:  Chest pain, reproducible with palpation of left chest, indicating musculoskeletal etiology.  Echo 05/2022 EF 60 to 65%, diastolic function normal.  Advised to follow-up with PCP regarding management of musculoskeletal pain.  OTC NSAIDs as needed okay. History of SVT s/p RFA 05/2020.  Palpitations, in  the context of drinking red bull and stress.  Cardiac monitor showed no significant arrhythmias.  Echocardiogram with normal EF 60 to 65%.  Patient made aware of results, reassured.  Advised to avoid stimulants/energy drinks.    Follow-up yearly.     Medication Adjustments/Labs and Tests Ordered: Current medicines are reviewed at length with the patient today.  Concerns regarding medicines are outlined above.  Orders Placed This Encounter  Procedures   EKG 12-Lead   No orders of the defined types were placed in this encounter.   Patient Instructions  Medication Instructions:   Your physician recommends that you continue on your current medications as directed. Please refer to the Current Medication list given to you today.  *If you need a refill on your cardiac medications before your next appointment, please call your pharmacy*   Lab Work:  None Ordered  If you have labs (blood work) drawn today and your tests are completely normal, you will receive your results only by: MyChart Message (if you have MyChart) OR A paper copy in the mail If you have any lab test that is abnormal or we need to change your treatment, we will call you to review the results.   Testing/Procedures:  None Ordered   Follow-Up: At [facility], you and your health needs are our priority.  As part of our continuing mission to provide you with  exceptional heart care, we have created designated Provider Care Teams.  These Care Teams include your primary Cardiologist (physician) and Advanced Practice Providers (APPs -  Physician Assistants and Nurse Practitioners) who all work together to provide you with the care you need, when you need it.  We recommend signing up for the patient portal called "MyChart".  Sign up information is provided on this After Visit Summary.  MyChart is used to connect with patients for Virtual Visits (Telemedicine).  Patients are able to view lab/test results, encounter notes, upcoming appointments, etc.  Non-urgent messages can be sent to your provider as well.   To learn more about what you can do with MyChart, go to [email or web address].    Your next appointment:   12 month(s)  Provider:   You may see Debbe Odea, MD or one of the following Advanced Practice Providers on your designated Care Team:   Nicolasa Ducking, NP Eula Listen, PA-C Cadence Fransico Michael, PA-C Charlsie Quest, NP Carlos Levering, NP   Signed, Debbe Odea, MD  02/16/2023 10:55 AM     [facility]

## 2023-02-21 ENCOUNTER — Ambulatory Visit (INDEPENDENT_AMBULATORY_CARE_PROVIDER_SITE_OTHER): Payer: Medicaid Other | Admitting: Dermatology

## 2023-02-21 ENCOUNTER — Encounter: Payer: Self-pay | Admitting: Dermatology

## 2023-02-21 DIAGNOSIS — L219 Seborrheic dermatitis, unspecified: Secondary | ICD-10-CM | POA: Diagnosis not present

## 2023-02-21 DIAGNOSIS — L209 Atopic dermatitis, unspecified: Secondary | ICD-10-CM

## 2023-02-21 DIAGNOSIS — L309 Dermatitis, unspecified: Secondary | ICD-10-CM

## 2023-02-21 MED ORDER — OPZELURA 1.5 % EX CREA: TOPICAL_CREAM | CUTANEOUS | 2 refills | Status: AC

## 2023-02-21 MED ORDER — HYDROCORTISONE 2.5 % EX CREA: TOPICAL_CREAM | CUTANEOUS | 2 refills | Status: DC

## 2023-02-21 MED ORDER — KETOCONAZOLE 2 % EX SHAM: MEDICATED_SHAMPOO | CUTANEOUS | 11 refills | Status: AC

## 2023-02-21 NOTE — Patient Instructions (Addendum)
Start [name] cream twice daily to affected areas.   Continue Ketoconazole 2% shampoo as directed.   Start Hydrocortisone 2.5% cream twice daily until smooth.    Topical steroids (such as triamcinolone, fluocinolone, fluocinonide, mometasone, clobetasol, halobetasol, betamethasone, hydrocortisone) can cause thinning and lightening of the skin if they are used for too long in the same area. Your physician has selected the right strength medicine for your problem and area affected on the body. Please use your medication only as directed by your physician to prevent side effects.     Due to recent changes in healthcare laws, you may see results of your pathology and/or laboratory studies on MyChart before the doctors have had a chance to review them. We understand that in some cases there may be results that are confusing or concerning to you. Please understand that not all results are received at the same time and often the doctors may need to interpret multiple results in order to provide you with the best plan of care or course of treatment. Therefore, we ask that you please give [place] 2 business days to thoroughly review all your results before contacting the office for clarification. Should we see a critical lab result, you will be contacted sooner.   If You Need Anything After Your Visit  If you have any questions or concerns for your doctor, please call our main line at [phone] and press option 4 to reach your doctor's medical assistant. If no one answers, please leave a voicemail as directed and we will return your call as soon as possible. Messages left after 4 pm will be answered the following business day.   You may also send [place] a message via MyChart. We typically respond to MyChart messages within 1-2 business days.  For prescription refills, please ask your pharmacy to contact our office. Our fax number is [phone].  If you have an urgent issue when the clinic is closed that  cannot wait until the next business day, you can page your doctor at the number below.    Please note that while we do our best to be available for urgent issues outside of office hours, we are not available 24/7.   If you have an urgent issue and are unable to reach [place], you may choose to seek medical care at your doctor's office, retail clinic, urgent care center, or emergency room.  If you have a medical emergency, please immediately call 911 or go to the emergency department.  Pager Numbers  - Dr. Gwen Pounds: [phone]  - Dr. Roseanne Reno: [phone]  - Dr. Katrinka Blazing: [phone]   In the event of inclement weather, please call our main line at [phone] for an update on the status of any delays or closures.  Dermatology Medication Tips: Please keep the boxes that topical medications come in in order to help keep track of the instructions about where and how to use these. Pharmacies typically print the medication instructions only on the boxes and not directly on the medication tubes.   If your medication is too expensive, please contact our office at [phone] option 4 or send [place] a message through MyChart.   We are unable to tell what your co-pay for medications will be in advance as this is different depending on your insurance coverage. However, we may be able to find a substitute medication at lower cost or fill out paperwork to get insurance to cover a needed medication.   If a prior authorization is required to get your  medication covered by your insurance company, please allow [place] 1-2 business days to complete this process.  Drug prices often vary depending on where the prescription is filled and some pharmacies may offer cheaper prices.  The website www.goodrx.com contains coupons for medications through different pharmacies. The prices here do not account for what the cost may be with help from insurance (it may be cheaper with your insurance), but the website can give you  the price if you did not use any insurance.  - You can print the associated coupon and take it with your prescription to the pharmacy.  - You may also stop by our office during regular business hours and pick up a GoodRx coupon card.  - If you need your prescription sent electronically to a different pharmacy, notify our office through [facility] or by phone at [phone] option 4.     Si Usted Necesita Algo Despus de Su Visita  Tambin puede enviarnos un mensaje a travs de Clinical cytogeneticist. Por lo general respondemos a los mensajes de MyChart en el transcurso de 1 a 2 das hbiles.  Para renovar recetas, por favor pida a su farmacia que se ponga en contacto con nuestra oficina. Annie Sable de fax es [place] [phone].  Si tiene un asunto urgente cuando la clnica est cerrada y que no puede esperar hasta el siguiente da hbil, puede llamar/localizar a su doctor(a) al nmero que aparece a continuacin.   Por favor, tenga en cuenta que aunque hacemos todo lo posible para estar disponibles para asuntos urgentes fuera del horario de [place], no estamos disponibles las 24 horas del da, los 7 [address]  [address] de la [place].   Si tiene un problema urgente y no puede comunicarse con nosotros, puede optar por buscar atencin mdica  en el consultorio de su doctor(a), en una clnica privada, en un centro de atencin urgente o en una sala de emergencias.  Si tiene Engineer, drilling, por favor llame inmediatamente al 911 o vaya a la sala de emergencias.  Nmeros de bper  - Dr. Gwen Pounds: [phone]  - Dra. Roseanne Reno: [phone]  - Dr. Katrinka Blazing: [phone]   En caso de inclemencias del tiempo, por favor llame a Lacy Duverney principal al [phone] para una actualizacin sobre el [place] de cualquier retraso o cierre.  Consejos para la medicacin en dermatologa: Por favor, guarde las cajas en las que vienen los medicamentos de uso tpico para ayudarle a seguir las instrucciones sobre dnde y  cmo usarlos. Las farmacias generalmente imprimen las instrucciones del medicamento slo en las cajas y no directamente en los tubos del [place].   Si su medicamento es muy caro, por favor, pngase en contacto con Rolm Gala llamando al [phone] y presione la opcin 4 o envenos un mensaje a travs de Clinical cytogeneticist.   No podemos decirle cul ser su copago por los medicamentos por adelantado ya que esto es diferente dependiendo de la cobertura de su seguro. Sin embargo, es posible que podamos encontrar un medicamento sustituto a Audiological scientist un formulario para que el seguro cubra el medicamento que se considera necesario.   Si se requiere una autorizacin previa para que su compaa de seguros [place] su medicamento, por favor permtanos de 1 a 2 das hbiles para completar [address]  Los precios de los medicamentos varan con frecuencia dependiendo del Environmental consultant de dnde se surte la receta y alguna farmacias pueden ofrecer precios ms baratos.  El sitio web www.goodrx.com tiene cupones para medicamentos de Health and safety inspector. Los  precios aqu no tienen en cuenta lo que podra costar con la ayuda del seguro (puede ser ms barato con su seguro), pero el sitio web puede darle el precio si no utiliz Tourist information centre manager.  - Puede imprimir el cupn correspondiente y llevarlo con su receta a la farmacia.  - Tambin puede pasar por nuestra oficina durante el horario de atencin regular y Education officer, museum una tarjeta de cupones de GoodRx.  - Si necesita que su receta se enve electrnicamente a una farmacia diferente, informe a nuestra oficina a travs de MyChart de [place] o por telfono llamando al [phone] y presione la opcin 4.

## 2023-02-21 NOTE — Progress Notes (Addendum)
   Follow-Up Visit   Subjective  Ashley Herman is a 27 y.o. female who presents for the following: Seborrheic dermatitis. 2 week follow up. Just finished Fluconazole 150 mg tablet. Zoryve helps a lot but is not covered by insurance. (MCD). Will need to fail calcipotriene in order to be covered.  Feels like forehead is improving and thinks color of skin is improving.  States medial left lower eyelid is sensitive to touch. Eyelids have been worse over the weekend.    The following portions of the chart were reviewed this encounter and updated as appropriate: medications, allergies, medical history  Review of Systems:  No other skin or systemic complaints except as noted in HPI or Assessment and Plan.  Objective  Well appearing patient in no apparent distress; mood and affect are within normal limits.  Areas Examined: Scalp, ears and face  Relevant physical exam findings are noted in the Assessment and Plan.    Assessment & Plan   Atopic dermatitis, unspecified type  SEBORRHEIC DERMATITIS Failed pimecrolimus 08/24/22, roflumilast Exam: improvement in perinasal and cheek dermatitis  Chronic and persistent condition with duration or expected duration over one year. Condition is improving with treatment but not currently at goal.   Seborrheic Dermatitis  -  is a chronic persistent rash characterized by pinkness and scaling most commonly of the mid face but also can occur on the scalp (dandruff), ears; mid chest, mid back and groin.  It tends to be exacerbated by stress and cooler weather.  People who have neurologic disease may experience new onset or exacerbation of existing seborrheic dermatitis.  The condition is not curable but treatable and can be controlled.  Treatment Plan: Start [name] cream twice daily to affected areas.   Continue Ketoconazole 2% shampoo as directed.    Eczematous Dermatitis  Exam: hyperpigmented scaly plaques at right forehead, eyelids, right  clavicular skin  Chronic and persistent condition with duration or expected duration over one year. Condition is improving with treatment but not currently at goal.  Treatment Plan: Start [name] cream twice daily to affected areas, then. Then switch to Hydrocortisone 2.5% cream twice daily until smooth.  Sent opzelura prescription for long-term use  Topical steroids (such as triamcinolone, fluocinolone, fluocinonide, mometasone, clobetasol, halobetasol, betamethasone, hydrocortisone) can cause thinning and lightening of the skin if they are used for too long in the same area. Your physician has selected the right strength medicine for your problem and area affected on the body. Please use your medication only as directed by your physician to prevent side effects.    Return for Rash Follow Up 2-4 weeks, Patch Testing next available.  I, Lawson Radar, CMA, am acting as scribe for Elie Goody, MD.   Documentation: I have reviewed the above documentation for accuracy and completeness, and I agree with the above.  Elie Goody, MD

## 2023-02-22 ENCOUNTER — Ambulatory Visit: Payer: Medicaid Other | Admitting: Dermatology

## 2023-02-24 ENCOUNTER — Encounter: Payer: Self-pay | Admitting: Dermatology

## 2023-02-27 ENCOUNTER — Telehealth: Payer: Self-pay

## 2023-02-27 NOTE — Telephone Encounter (Signed)
[name] PA denied. Patient must try and fail Pimecrolimus or Tacrolimus first.

## 2023-03-07 NOTE — Telephone Encounter (Signed)
[name] still denied with Pimecrolimus trail and failure. With medicaid she is going to have to try and fail two preferred before [name] would be approved.   - Tacrolimus -[name]

## 2023-03-08 ENCOUNTER — Other Ambulatory Visit (HOSPITAL_COMMUNITY)
Admission: RE | Admit: 2023-03-08 | Discharge: 2023-03-08 | Disposition: A | Discharge status: Home / Self Care | Payer: Medicaid Other | Source: Ambulatory Visit | Attending: Obstetrics and Gynecology | Admitting: Obstetrics and Gynecology

## 2023-03-08 ENCOUNTER — Encounter: Payer: Self-pay | Admitting: Dermatology

## 2023-03-08 ENCOUNTER — Ambulatory Visit: Payer: Medicaid Other | Admitting: Dermatology

## 2023-03-08 ENCOUNTER — Other Ambulatory Visit: Payer: Medicaid Other

## 2023-03-08 DIAGNOSIS — L209 Atopic dermatitis, unspecified: Secondary | ICD-10-CM

## 2023-03-08 DIAGNOSIS — L219 Seborrheic dermatitis, unspecified: Secondary | ICD-10-CM

## 2023-03-08 DIAGNOSIS — L308 Other specified dermatitis: Secondary | ICD-10-CM

## 2023-03-08 DIAGNOSIS — Z113 Encounter for screening for infections with a predominantly sexual mode of transmission: Secondary | ICD-10-CM | POA: Insufficient documentation

## 2023-03-08 DIAGNOSIS — Z7189 Other specified counseling: Secondary | ICD-10-CM | POA: Diagnosis not present

## 2023-03-08 MED ORDER — EUCRISA 2 % EX OINT: TOPICAL_OINTMENT | CUTANEOUS | 2 refills | Status: AC

## 2023-03-08 NOTE — Patient Instructions (Addendum)
Start Eucrisa ointment twice daily to affected areas.     Due to recent changes in healthcare laws, you may see results of your pathology and/or laboratory studies on MyChart before the doctors have had a chance to review them. We understand that in some cases there may be results that are confusing or concerning to you. Please understand that not all results are received at the same time and often the doctors may need to interpret multiple results in order to provide you with the best plan of care or course of treatment. Therefore, we ask that you please give [place] 2 business days to thoroughly review all your results before contacting the office for clarification. Should we see a critical lab result, you will be contacted sooner.   If You Need Anything After Your Visit  If you have any questions or concerns for your doctor, please call our main line at [phone] and press option 4 to reach your doctor's medical assistant. If no one answers, please leave a voicemail as directed and we will return your call as soon as possible. Messages left after 4 pm will be answered the following business day.   You may also send [place] a message via MyChart. We typically respond to MyChart messages within 1-2 business days.  For prescription refills, please ask your pharmacy to contact our office. Our fax number is [phone].  If you have an urgent issue when the clinic is closed that cannot wait until the next business day, you can page your doctor at the number below.    Please note that while we do our best to be available for urgent issues outside of office hours, we are not available 24/7.   If you have an urgent issue and are unable to reach [place], you may choose to seek medical care at your doctor's office, retail clinic, urgent care center, or emergency room.  If you have a medical emergency, please immediately call 911 or go to the emergency department.  Pager Numbers  - Dr. Gwen Pounds:  [phone]  - Dr. Roseanne Reno: [phone]  - Dr. Katrinka Blazing: [phone]   In the event of inclement weather, please call our main line at [phone] for an update on the status of any delays or closures.  Dermatology Medication Tips: Please keep the boxes that topical medications come in in order to help keep track of the instructions about where and how to use these. Pharmacies typically print the medication instructions only on the boxes and not directly on the medication tubes.   If your medication is too expensive, please contact our office at [phone] option 4 or send [place] a message through MyChart.   We are unable to tell what your co-pay for medications will be in advance as this is different depending on your insurance coverage. However, we may be able to find a substitute medication at lower cost or fill out paperwork to get insurance to cover a needed medication.   If a prior authorization is required to get your medication covered by your insurance company, please allow [place] 1-2 business days to complete this process.  Drug prices often vary depending on where the prescription is filled and some pharmacies may offer cheaper prices.  The website www.goodrx.com contains coupons for medications through different pharmacies. The prices here do not account for what the cost may be with help from insurance (it may be cheaper with your insurance), but the website can give you the price if you did not use any  insurance.  - You can print the associated coupon and take it with your prescription to the pharmacy.  - You may also stop by our office during regular business hours and pick up a GoodRx coupon card.  - If you need your prescription sent electronically to a different pharmacy, notify our office through [facility] or by phone at [phone] option 4.     Si Usted Necesita Algo Despus de Su Visita  Tambin puede enviarnos un mensaje a travs de Clinical cytogeneticist. Por lo general  respondemos a los mensajes de MyChart en el transcurso de 1 a 2 das hbiles.  Para renovar recetas, por favor pida a su farmacia que se ponga en contacto con nuestra oficina. Annie Sable de fax es [place] [phone].  Si tiene un asunto urgente cuando la clnica est cerrada y que no puede esperar hasta el siguiente da hbil, puede llamar/localizar a su doctor(a) al nmero que aparece a continuacin.   Por favor, tenga en cuenta que aunque hacemos todo lo posible para estar disponibles para asuntos urgentes fuera del horario de [place], no estamos disponibles las 24 horas del da, los 7 [address]  [address] de la [place].   Si tiene un problema urgente y no puede comunicarse con nosotros, puede optar por buscar atencin mdica  en el consultorio de su doctor(a), en una clnica privada, en un centro de atencin urgente o en una sala de emergencias.  Si tiene Engineer, drilling, por favor llame inmediatamente al 911 o vaya a la sala de emergencias.  Nmeros de bper  - Dr. Gwen Pounds: [phone]  - Dra. Roseanne Reno: [phone]  - Dr. Katrinka Blazing: [phone]   En caso de inclemencias del tiempo, por favor llame a Lacy Duverney principal al [phone] para una actualizacin sobre el [place] de cualquier retraso o cierre.  Consejos para la medicacin en dermatologa: Por favor, guarde las cajas en las que vienen los medicamentos de uso tpico para ayudarle a seguir las instrucciones sobre dnde y cmo usarlos. Las farmacias generalmente imprimen las instrucciones del medicamento slo en las cajas y no directamente en los tubos del [place].   Si su medicamento es muy caro, por favor, pngase en contacto con Rolm Gala llamando al [phone] y presione la opcin 4 o envenos un mensaje a travs de Clinical cytogeneticist.   No podemos decirle cul ser su copago por los medicamentos por adelantado ya que esto es diferente dependiendo de la cobertura de su seguro. Sin embargo, es posible que podamos encontrar un  medicamento sustituto a Audiological scientist un formulario para que el seguro cubra el medicamento que se considera necesario.   Si se requiere una autorizacin previa para que su compaa de seguros [place] su medicamento, por favor permtanos de 1 a 2 das hbiles para completar [address]  Los precios de los medicamentos varan con frecuencia dependiendo del Environmental consultant de dnde se surte la receta y alguna farmacias pueden ofrecer precios ms baratos.  El sitio web www.goodrx.com tiene cupones para medicamentos de Health and safety inspector. Los precios aqu no tienen en cuenta lo que podra costar con la ayuda del seguro (puede ser ms barato con su seguro), pero el sitio web puede darle el precio si no utiliz Tourist information centre manager.  - Puede imprimir el cupn correspondiente y llevarlo con su receta a la farmacia.  - Tambin puede pasar por nuestra oficina durante el horario de atencin regular y Education officer, museum una tarjeta de cupones de GoodRx.  - Si necesita que su receta se enve electrnicamente a  una farmacia diferente, informe a nuestra oficina a travs de MyChart de [place] o por telfono llamando al [phone] y presione la opcin 4.

## 2023-03-08 NOTE — Progress Notes (Signed)
   Follow-Up Visit   Subjective  Ashley Herman is a 27 y.o. female who presents for the following: Seborrheic dermatitis, 2 week follow up. Nose, cheeks. Patient reports a mild flare this weekend. Color has returned to normal. [name] helps most. Would like another sample.   Recheck eczematous dermatitis at face, right clavicular skin, eyelids.    The following portions of the chart were reviewed this encounter and updated as appropriate: medications, allergies, medical history  Review of Systems:  No other skin or systemic complaints except as noted in HPI or Assessment and Plan.  Objective  Well appearing patient in no apparent distress; mood and affect are within normal limits.  Areas Examined: Scalp, ears and face  Relevant physical exam findings are noted in the Assessment and Plan.    Assessment & Plan   SEBORRHEIC DERMATITIS Exam: perinasal scaly patches  Chronic condition with duration or expected duration over one year. Currently improved but still flaring   Seborrheic Dermatitis  -  is a chronic persistent rash characterized by pinkness and scaling most commonly of the mid face but also can occur on the scalp (dandruff), ears; mid chest, mid back and groin.  It tends to be exacerbated by stress and cooler weather.  People who have neurologic disease may experience new onset or exacerbation of existing seborrheic dermatitis.  The condition is not curable but treatable and can be controlled.  Treatment Plan: Start Eucrisa ointment twice daily to affected areas. Don't pick up if expensive. May need prior auth   Eczematous Dermatitis, chronic, improving with treatment but still flaring, not at patient goal  Exam: hyperpigmented scaly patch at right clavicular skin, mild erythema and edema with scale at bilateral upper and lower eyelids.  Treatment:  Start Eucrisa ointment twice daily to affected areas.  Recommend removing acrylic nails. Advised can develop an  allergy to acrylic.  Follow up patch testing If not improved with [name] will submit for [name] again.  [name] sample given today to apply once or twice daily to affected areas as needed.   Return for Patch Testing As Scheduled.  I, Lawson Radar, CMA, am acting as scribe for Elie Goody, MD.   Documentation: I have reviewed the above documentation for accuracy and completeness, and I agree with the above.  Elie Goody, MD

## 2023-03-08 NOTE — Progress Notes (Signed)
SUBJECTIVE:  27 y.o. female who desires a STI screen. Denies abnormal vaginal discharge, bleeding or significant pelvic pain. No UTI symptoms. Denies history of known exposure to STD.  No LMP recorded. (Menstrual status: IUD).  OBJECTIVE:  She appears well.   ASSESSMENT:  STI Screen   PLAN:  Pt offered STI blood screening-declined GC, chlamydia, and trichomonas probe sent to lab.  Treatment: To be determined once lab results are received.  Pt follow up as needed.

## 2023-03-10 LAB — CERVICOVAGINAL ANCILLARY ONLY
Bacterial Vaginitis (gardnerella): POSITIVE — AB
Candida Glabrata: NEGATIVE
Candida Vaginitis: NEGATIVE
Chlamydia: NEGATIVE
Comment: NEGATIVE
Comment: NEGATIVE
Comment: NEGATIVE
Comment: NEGATIVE
Comment: NEGATIVE
Comment: NORMAL
Neisseria Gonorrhea: NEGATIVE
Trichomonas: NEGATIVE

## 2023-03-12 ENCOUNTER — Encounter: Payer: Self-pay | Admitting: Obstetrics & Gynecology

## 2023-03-13 ENCOUNTER — Other Ambulatory Visit: Payer: Self-pay | Admitting: *Deleted

## 2023-03-13 MED ORDER — METRONIDAZOLE 500 MG PO TABS: 500.0000 mg | ORAL_TABLET | Freq: Two times a day (BID) | ORAL | 0 refills | Status: AC

## 2023-03-14 ENCOUNTER — Ambulatory Visit: Payer: Medicaid Other

## 2023-03-14 DIAGNOSIS — L209 Atopic dermatitis, unspecified: Secondary | ICD-10-CM

## 2023-03-14 DIAGNOSIS — L219 Seborrheic dermatitis, unspecified: Secondary | ICD-10-CM | POA: Diagnosis not present

## 2023-03-14 NOTE — Progress Notes (Signed)
Patch testing was performed on 03/14/2023 using standard technique. True test x 36 applied to back at visit today. Pt advised to keep panels dry until removal in 2 days for first read.   Dorathy Daft, RMA

## 2023-03-16 ENCOUNTER — Ambulatory Visit: Payer: Medicaid Other

## 2023-03-16 DIAGNOSIS — L219 Seborrheic dermatitis, unspecified: Secondary | ICD-10-CM

## 2023-03-16 NOTE — Progress Notes (Signed)
Patient here today for two day patch test reading. Patient showed reaction to site #1. Patient and grandmother advised how to read patch testing through Monday, until follow up with Dr. Katrinka Blazing. Patient advised do not soak and do not scrub test site.   Dorathy Daft, RMA

## 2023-03-21 ENCOUNTER — Ambulatory Visit: Payer: Medicaid Other | Admitting: Dermatology

## 2023-03-21 ENCOUNTER — Encounter: Payer: Self-pay | Admitting: Dermatology

## 2023-03-21 DIAGNOSIS — L2389 Allergic contact dermatitis due to other agents: Secondary | ICD-10-CM

## 2023-03-21 NOTE — Progress Notes (Signed)
   Follow-Up Visit   Subjective  Ashley Herman is a 27 y.o. female who presents for the following: patch test follow up. Patient advises that over the weekend she had #36, #21, #5, #4 and #28 but went away, she was very itchy at #1.  The patient has spots, moles and lesions to be evaluated, some may be new or changing and the patient may have concern these could be cancer.   The following portions of the chart were reviewed this encounter and updated as appropriate: medications, allergies, medical history  Review of Systems:  No other skin or systemic complaints except as noted in HPI or Assessment and Plan.  Objective  Well appearing patient in no apparent distress; mood and affect are within normal limits.   A focused examination was performed of the following areas: back  Relevant exam findings are noted in the Assessment and Plan.    Assessment & Plan   Eczematous dermatitis with evidence of allergic contact Exam: Eczematous dermatitis in 1 and 2 on back (patch test) Resolution of eczema on face and right upper chest (PIH on chest)   Treatment Plan: Positive reaction to #1 - Nickel Sulfate, #33 - Bacitracin. Added to allergies Patient advised to avoid nickel, T.R.U.E Test patient information given to patient.   Continue Eucrisa and [name] daily as needed for rash.  Rash    Return if symptoms worsen or fail to improve.  Anise Salvo, RMA, am acting as scribe for Elie Goody, MD .   Documentation: I have reviewed the above documentation for accuracy and completeness, and I agree with the above.  Elie Goody, MD

## 2023-03-21 NOTE — Patient Instructions (Signed)

## 2023-04-24 ENCOUNTER — Ambulatory Visit: Payer: Medicaid Other | Admitting: Family

## 2023-04-24 VITALS — BP 102/66 | HR 68 | Temp 98.7°F | Ht 61.0 in | Wt 140.0 lb

## 2023-04-24 DIAGNOSIS — J22 Unspecified acute lower respiratory infection: Secondary | ICD-10-CM | POA: Diagnosis not present

## 2023-04-24 DIAGNOSIS — B9689 Other specified bacterial agents as the cause of diseases classified elsewhere: Secondary | ICD-10-CM | POA: Diagnosis not present

## 2023-04-24 DIAGNOSIS — R051 Acute cough: Secondary | ICD-10-CM | POA: Diagnosis not present

## 2023-04-24 MED ORDER — BENZONATATE 200 MG PO CAPS: 200.0000 mg | ORAL_CAPSULE | Freq: Two times a day (BID) | ORAL | 0 refills | Status: DC | PRN

## 2023-04-24 MED ORDER — PREDNISONE 10 MG (21) PO TBPK: ORAL_TABLET | ORAL | 0 refills | Status: DC

## 2023-04-24 MED ORDER — AMOXICILLIN-POT CLAVULANATE 875-125 MG PO TABS: 1.0000 | ORAL_TABLET | Freq: Two times a day (BID) | ORAL | 0 refills | Status: DC

## 2023-04-24 NOTE — Assessment & Plan Note (Signed)
 Take antibiotic as prescribed. Increase oral fluids. Pt to f/u if sx worsen and or fail to improve in 2-3 days.  rx augmentin 875/125 mg po bid x 10 days Rx prednisone pack  Benzonatate 200 mg prn

## 2023-04-24 NOTE — Progress Notes (Signed)
 Established Patient Office Visit  Subjective:   Patient ID: Ashley Herman, female    DOB: 1995-06-08  Age: 28 y.o. MRN: [id number]  CC:  Chief Complaint  Patient presents with   Acute Visit    Reports a possible head cold. Was seen at urgent care and was tested for flu and COVID but both were negative but is still coughing with production of yellow mucus.    HPI: Ashley Herman is a 28 y.o. female presenting on 04/24/2023 for Acute Visit (Reports a possible head cold. Was seen at urgent care and was tested for flu and COVID but both were negative but is still coughing with production of yellow mucus.)  six days ago started loss of smell, and sore throat, ear pain and nasal congestion. Since has progressive to a productive cough with yellow sputum. She does have coughing episodes which she states are excessive. No sob and or doe. She does have some chest tightness.   Went to urgent care and was tested for covid strep and flu of which she said was negative.   Otc medications tried some nyquil and it helped slightly however did have cough attacks that woke her up. Not sleeping very well.        ROS: Negative unless specifically indicated above in HPI.   Relevant past medical history reviewed and updated as indicated.   Allergies and medications reviewed and updated.   Current Outpatient Medications:    amoxicillin -clavulanate (AUGMENTIN ) 875-125 MG tablet, Take 1 tablet by mouth 2 (two) times daily., Disp: 20 tablet, Rfl: 0   benzonatate  (TESSALON ) 200 MG capsule, Take 1 capsule (200 mg total) by mouth 2 (two) times daily as needed for cough., Disp: 20 capsule, Rfl: 0   Crisaborole  (EUCRISA ) 2 % OINT, Apply twice daily to affected areas, Disp: 60 g, Rfl: 2   Fluocinolone  Acetonide Scalp (DERMA-SMOOTHE /FS SCALP) 0.01 % OIL, Apply once or twice daily to affected areas on scalp as needed for itching., Disp: 120 mL, Rfl: 4   fluticasone  (FLONASE ) 50 MCG/ACT nasal spray, Place 2  sprays into both nostrils daily., Disp: 16 g, Rfl: 6   hydrocortisone  2.5 % cream, Apply twice daily until smooth, Disp: 30 g, Rfl: 2   ketoconazole  (NIZORAL ) 2 % cream, Apply twice daily to affected areas on face., Disp: 30 g, Rfl: 2   ketoconazole  (NIZORAL ) 2 % shampoo, 2-3 times per week lather on scalp, leave on 8-10 minutes, rinse well., Disp: 120 mL, Rfl: 11   metroNIDAZOLE  (METROGEL ) 1 % gel, Apply topically daily., Disp: 45 g, Rfl: 0   pimecrolimus  (ELIDEL ) 1 % cream, Apply twice daily to affected areas on face., Disp: 30 g, Rfl: 2   predniSONE  (STERAPRED UNI-PAK 21 TAB) 10 MG (21) TBPK tablet, Take as directed, Disp: 1 each, Rfl: 0   Roflumilast  (ZORYVE ) 0.3 % CREA, Apply once daily to affected areas on face, Disp: 60 g, Rfl: 2   Ruxolitinib Phosphate  ([name] ) 1.5 % CREA, Apply twice daily to affected areas on face, Disp: 60 g, Rfl: 2   Vitamin D , Ergocalciferol , (DRISDOL ) 1.25 MG (50000 UNIT) CAPS capsule, Take 1 capsule (50,000 Units total) by mouth every 7 (seven) days., Disp: 4 capsule, Rfl: 1  Allergies  Allergen Reactions   Bacitracin Rash    Patch test positive    Nickel Rash    Patch test positive    Objective:   BP 102/66 (BP Location: Left Arm, Patient Position: Sitting, Cuff Size: Normal)   Pulse 68  Temp 98.7 F (37.1 C) (Temporal)   Ht 5' 1 (1.549 m)   Wt 140 lb (63.5 kg)   SpO2 98%   BMI 26.45 kg/m    Physical Exam Vitals reviewed.  Constitutional:      General: She is not in acute distress.    Appearance: Normal appearance. She is normal weight. She is not ill-appearing, toxic-appearing or diaphoretic.  HENT:     Head: Normocephalic.     Right Ear: Tympanic membrane normal.     Left Ear: Tympanic membrane normal.     Nose: Nose normal.     Mouth/Throat:     Mouth: Mucous membranes are dry.     Pharynx: No oropharyngeal exudate or posterior oropharyngeal erythema.     Tonsils: No tonsillar exudate or tonsillar abscesses. 1+ on the right. 1+ on  the left.  Eyes:     Extraocular Movements: Extraocular movements intact.     Pupils: Pupils are equal, round, and reactive to light.  Cardiovascular:     Rate and Rhythm: Normal rate and regular rhythm.     Pulses: Normal pulses.     Heart sounds: Normal heart sounds.  Pulmonary:     Effort: Pulmonary effort is normal.     Breath sounds: Normal breath sounds.  Musculoskeletal:     Cervical back: Normal range of motion.  Lymphadenopathy:     Cervical: Cervical adenopathy present.     Right cervical: Superficial cervical adenopathy present.  Neurological:     General: No focal deficit present.     Mental Status: She is alert and oriented to person, place, and time. Mental status is at baseline.  Psychiatric:        Mood and Affect: Mood normal.        Behavior: Behavior normal.        Thought Content: Thought content normal.        Judgment: Judgment normal.     Assessment & Plan:  Acute cough -     Benzonatate ; Take 1 capsule (200 mg total) by mouth 2 (two) times daily as needed for cough.  Dispense: 20 capsule; Refill: 0  Bacterial lower respiratory infection Assessment & Plan: Take antibiotic as prescribed. Increase oral fluids. Pt to f/u if sx worsen and or fail to improve in 2-3 days.  rx augmentin  875/125 mg po bid x 10 days Rx prednisone  pack  Benzonatate  200 mg prn   Orders: -     predniSONE ; Take as directed  Dispense: 1 each; Refill: 0 -     Amoxicillin -Pot Clavulanate; Take 1 tablet by mouth 2 (two) times daily.  Dispense: 20 tablet; Refill: 0     Follow up plan: Return if symptoms worsen or fail to improve.  Ginger Patrick, FNP

## 2023-06-04 ENCOUNTER — Encounter: Payer: Self-pay | Admitting: Dermatology

## 2023-06-04 DIAGNOSIS — L209 Atopic dermatitis, unspecified: Secondary | ICD-10-CM

## 2023-06-04 DIAGNOSIS — L219 Seborrheic dermatitis, unspecified: Secondary | ICD-10-CM

## 2023-06-05 MED ORDER — HYDROCORTISONE 2.5 % EX CREA: TOPICAL_CREAM | CUTANEOUS | 2 refills | Status: DC

## 2023-08-09 ENCOUNTER — Telehealth: Payer: Self-pay

## 2023-08-09 NOTE — Telephone Encounter (Signed)
 Left message for pt to call office back regarding scheduling annual visit.

## 2023-08-17 ENCOUNTER — Ambulatory Visit: Admitting: Dermatology

## 2023-08-17 ENCOUNTER — Encounter: Payer: Self-pay | Admitting: Dermatology

## 2023-08-17 DIAGNOSIS — L305 Pityriasis alba: Secondary | ICD-10-CM

## 2023-08-17 DIAGNOSIS — L209 Atopic dermatitis, unspecified: Secondary | ICD-10-CM

## 2023-08-17 MED ORDER — OPZELURA 1.5 % EX CREA: TOPICAL_CREAM | CUTANEOUS | 1 refills | Status: DC

## 2023-08-17 MED ORDER — OPZELURA 1.5 % EX CREA
TOPICAL_CREAM | CUTANEOUS | 1 refills | Status: DC
Start: 2023-08-17 — End: 2023-08-17

## 2023-08-17 NOTE — Patient Instructions (Signed)
 Your prescription was sent to [facility] in [place]. A representative from [employer] will contact you within 2 business hours to verify your address and insurance information to schedule a free delivery. If for any reason you do not receive a phone call from them, please reach out to them. Their phone number is [phone] and their hours are Monday-Friday 9:00 am-5:00 pm.     Due to recent changes in healthcare laws, you may see results of your pathology and/or laboratory studies on MyChart before the doctors have had a chance to review them. We understand that in some cases there may be results that are confusing or concerning to you. Please understand that not all results are received at the same time and often the doctors may need to interpret multiple results in order to provide you with the best plan of care or course of treatment. Therefore, we ask that you please give [place] 2 business days to thoroughly review all your results before contacting the office for clarification. Should we see a critical lab result, you will be contacted sooner.   If You Need Anything After Your Visit  If you have any questions or concerns for your doctor, please call our main line at [phone] and press option 4 to reach your doctor's medical assistant. If no one answers, please leave a voicemail as directed and we will return your call as soon as possible. Messages left after 4 pm will be answered the following business day.   You may also send [place] a message via MyChart. We typically respond to MyChart messages within 1-2 business days.  For prescription refills, please ask your pharmacy to contact our office. Our fax number is [phone].  If you have an urgent issue when the clinic is closed that cannot wait until the next business day, you can page your doctor at the number below.    Please note that while we do our best to be available for urgent issues outside of office hours, we are not  available 24/7.   If you have an urgent issue and are unable to reach [place], you may choose to seek medical care at your doctor's office, retail clinic, urgent care center, or emergency room.  If you have a medical emergency, please immediately call 911 or go to the emergency department.  Pager Numbers  - Dr. Gwen Pounds: [phone]  - Dr. Roseanne Reno: [phone]  - Dr. Katrinka Blazing: [phone]   In the event of inclement weather, please call our main line at [phone] for an update on the status of any delays or closures.  Dermatology Medication Tips: Please keep the boxes that topical medications come in in order to help keep track of the instructions about where and how to use these. Pharmacies typically print the medication instructions only on the boxes and not directly on the medication tubes.   If your medication is too expensive, please contact our office at [phone] option 4 or send [place] a message through MyChart.   We are unable to tell what your co-pay for medications will be in advance as this is different depending on your insurance coverage. However, we may be able to find a substitute medication at lower cost or fill out paperwork to get insurance to cover a needed medication.   If a prior authorization is required to get your medication covered by your insurance company, please allow [place] 1-2 business days to complete this process.  Drug prices often vary depending on where the prescription is  filled and some pharmacies may offer cheaper prices.  The website www.goodrx.com contains coupons for medications through different pharmacies. The prices here do not account for what the cost may be with help from insurance (it may be cheaper with your insurance), but the website can give you the price if you did not use any insurance.  - You can print the associated coupon and take it with your prescription to the pharmacy.  - You may also stop by our office during regular business hours  and pick up a GoodRx coupon card.  - If you need your prescription sent electronically to a different pharmacy, notify our office through [facility] or by phone at [phone] option 4.     Si Usted Necesita Algo Despus de Su Visita  Tambin puede enviarnos un mensaje a travs de Clinical cytogeneticist. Por lo general respondemos a los mensajes de MyChart en el transcurso de 1 a 2 das hbiles.  Para renovar recetas, por favor pida a su farmacia que se ponga en contacto con nuestra oficina. Annie Sable de fax es [place] [phone].  Si tiene un asunto urgente cuando la clnica est cerrada y que no puede esperar hasta el siguiente da hbil, puede llamar/localizar a su doctor(a) al nmero que aparece a continuacin.   Por favor, tenga en cuenta que aunque hacemos todo lo posible para estar disponibles para asuntos urgentes fuera del horario de [place], no estamos disponibles las 24 horas del da, los 7 [address]  [address] de la [place].   Si tiene un problema urgente y no puede comunicarse con nosotros, puede optar por buscar atencin mdica  en el consultorio de su doctor(a), en una clnica privada, en un centro de atencin urgente o en una sala de emergencias.  Si tiene Engineer, drilling, por favor llame inmediatamente al 911 o vaya a la sala de emergencias.  Nmeros de bper  - Dr. Gwen Pounds: [phone]  - Dra. Roseanne Reno: [phone]  - Dr. Katrinka Blazing: [phone]   En caso de inclemencias del tiempo, por favor llame a Lacy Duverney principal al [phone] para una actualizacin sobre el [place] de cualquier retraso o cierre.  Consejos para la medicacin en dermatologa: Por favor, guarde las cajas en las que vienen los medicamentos de uso tpico para ayudarle a seguir las instrucciones sobre dnde y cmo usarlos. Las farmacias generalmente imprimen las instrucciones del medicamento slo en las cajas y no directamente en los tubos del [place].   Si su medicamento es muy caro, por favor, pngase  en contacto con Rolm Gala llamando al [phone] y presione la opcin 4 o envenos un mensaje a travs de Clinical cytogeneticist.   No podemos decirle cul ser su copago por los medicamentos por adelantado ya que esto es diferente dependiendo de la cobertura de su seguro. Sin embargo, es posible que podamos encontrar un medicamento sustituto a Audiological scientist un formulario para que el seguro cubra el medicamento que se considera necesario.   Si se requiere una autorizacin previa para que su compaa de seguros [place] su medicamento, por favor permtanos de 1 a 2 das hbiles para completar [address]  Los precios de los medicamentos varan con frecuencia dependiendo del Environmental consultant de dnde se surte la receta y alguna farmacias pueden ofrecer precios ms baratos.  El sitio web www.goodrx.com tiene cupones para medicamentos de Health and safety inspector. Los precios aqu no tienen en cuenta lo que podra costar con la ayuda del seguro (puede ser ms barato con su seguro), pero el sitio web puede  darle el precio si no utiliz [employer].  - Puede imprimir el cupn correspondiente y llevarlo con su receta a la farmacia.  - Tambin puede pasar por nuestra oficina durante el horario de atencin regular y Education officer, museum una tarjeta de cupones de GoodRx.  - Si necesita que su receta se enve electrnicamente a una farmacia diferente, informe a nuestra oficina a travs de MyChart de [place] o por telfono llamando al [phone] y presione la opcin 4.

## 2023-08-17 NOTE — Progress Notes (Signed)
   Follow-Up Visit   Subjective  Ashley Herman is a 28 y.o. female who presents for the following: flares of seb derm at face. Patient has been using HC 2.5 % cr and Eucrisa  but thinks she needs something different. She also used sample of [name] .  The patient has spots, moles and lesions to be evaluated, some may be new or changing and the patient may have concern these could be cancer.   The following portions of the chart were reviewed this encounter and updated as appropriate: medications, allergies, medical history  Review of Systems:  No other skin or systemic complaints except as noted in HPI or Assessment and Plan.  Objective  Well appearing patient in no apparent distress; mood and affect are within normal limits.   A focused examination was performed of the following areas: face  Relevant exam findings are noted in the Assessment and Plan.    Assessment & Plan   Atopic dermatitis and pityriasis alba, failed hydrocortisone  2.5%, eucrisa , ketoconazole  cream, pimecrolimus  cream, roflumilast . Previously improved with opzelura . Chronic flaring not at goal Exam: hyperpigmented scaly patches of bilateral upper and lower eyelids and periorbital skin. Hypopigmented patches on cheeks and forehead  Atopic dermatitis (eczema) is a chronic, relapsing, pruritic condition that can significantly affect quality of life. It is often associated with allergic rhinitis and/or asthma and can require treatment with topical medications, phototherapy, or in severe cases biologic injectable medication (Dupixent; Adbry) or Oral JAK inhibitors.  Treatment Plan: Sample of [name]  given to patient to use 1-2 times daily. [facility] Lot # [id number]  Exp: 06/08/2024  Discussed Dupixent.  Dupilumab (Dupixent) is a treatment given by injection for adults and children with moderate-to-severe atopic dermatitis. Goal is control of skin condition, not cure. It is given as 2 injections at  the first dose followed by 1 injection ever 2 weeks thereafter.  Young children are dosed monthly.  Potential side effects include allergic reaction, herpes infections, injection site reactions and conjunctivitis (inflammation of the eyes).  The use of Dupixent requires long term medication management, including periodic office visits.    ATOPIC DERMATITIS, UNSPECIFIED TYPE   Related Medications Ruxolitinib Phosphate  ([name] ) 1.5 % CREA Apply twice daily to affected areas on face Crisaborole  (EUCRISA ) 2 % OINT Apply twice daily to affected areas hydrocortisone  2.5 % cream Apply twice daily until smooth Ruxolitinib Phosphate  ([name] ) 1.5 % CREA Apply to aa face 1-2 times daily as needed PITYRIASIS ALBA    Return for Eczema, as scheduled, with Dr. Felipe Horton.  Kerstin Peeling, RMA, am acting as scribe for Harris Liming, MD .   Documentation: I have reviewed the above documentation for accuracy and completeness, and I agree with the above.  Harris Liming, MD

## 2023-09-05 ENCOUNTER — Encounter: Admitting: Obstetrics & Gynecology

## 2023-09-05 ENCOUNTER — Ambulatory Visit

## 2023-09-05 ENCOUNTER — Other Ambulatory Visit (HOSPITAL_COMMUNITY)
Admission: RE | Admit: 2023-09-05 | Discharge: 2023-09-05 | Disposition: A | Discharge status: Home / Self Care | Source: Ambulatory Visit | Attending: Obstetrics & Gynecology | Admitting: Obstetrics & Gynecology

## 2023-09-05 DIAGNOSIS — Z113 Encounter for screening for infections with a predominantly sexual mode of transmission: Secondary | ICD-10-CM

## 2023-09-05 NOTE — Progress Notes (Unsigned)
 Ashley Herman

## 2023-09-05 NOTE — Progress Notes (Signed)
 SUBJECTIVE:  28 y.o. female who desires a STI screen. Denies abnormal vaginal discharge, bleeding or significant pelvic pain. No UTI symptoms. Denies history of known exposure to STD.  No LMP recorded. (Menstrual status: IUD).  OBJECTIVE:  She appears well.   ASSESSMENT:  STI Screen   PLAN:  Pt offered STI blood screening-requested GC, chlamydia, and trichomonas probe sent to lab.  Treatment: To be determined once lab results are received.  Pt follow up as needed.

## 2023-09-06 ENCOUNTER — Ambulatory Visit: Payer: Self-pay | Admitting: Obstetrics & Gynecology

## 2023-09-06 LAB — CERVICOVAGINAL ANCILLARY ONLY
Bacterial Vaginitis (gardnerella): NEGATIVE
Candida Glabrata: NEGATIVE
Candida Vaginitis: NEGATIVE
Chlamydia: NEGATIVE
Comment: NEGATIVE
Comment: NEGATIVE
Comment: NEGATIVE
Comment: NEGATIVE
Comment: NEGATIVE
Comment: NORMAL
Neisseria Gonorrhea: NEGATIVE
Trichomonas: NEGATIVE

## 2023-09-06 LAB — RPR+HBSAG+HCVAB+...
HIV Screen 4th Generation wRfx: NONREACTIVE
Hep C Virus Ab: NONREACTIVE
Hepatitis B Surface Ag: NEGATIVE
RPR Ser Ql: NONREACTIVE

## 2023-09-06 NOTE — Progress Notes (Signed)
 This encounter was created in error - please disregard.

## 2023-09-12 ENCOUNTER — Ambulatory Visit: Payer: Medicaid Other | Admitting: Dermatology

## 2023-09-13 ENCOUNTER — Ambulatory Visit: Admitting: Dermatology

## 2023-09-13 ENCOUNTER — Encounter: Payer: Self-pay | Admitting: Dermatology

## 2023-09-13 DIAGNOSIS — L305 Pityriasis alba: Secondary | ICD-10-CM | POA: Diagnosis not present

## 2023-09-13 DIAGNOSIS — L209 Atopic dermatitis, unspecified: Secondary | ICD-10-CM

## 2023-09-13 NOTE — Progress Notes (Signed)
   Follow-Up Visit   Subjective  Ashley Herman is a 28 y.o. female who presents for the following: 1 month f/u on Atopic Dermatitis on her face, treating with samples  [name]  cream with a good response, skin is much improved, patient report she recently changed her water filter at home which she think was contributing to her skin rash.  Patient report her insurance denied coverage for [name]  cream.    The following portions of the chart were reviewed this encounter and updated as appropriate: medications, allergies, medical history  Review of Systems:  No other skin or systemic complaints except as noted in HPI or Assessment and Plan.  Objective  Well appearing patient in no apparent distress; mood and affect are within normal limits.  Areas Examined: face  Relevant physical exam findings are noted in the Assessment and Plan.    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 It is often associated with allergic rhinitis and/or asthma and can require treatment with topical medications, phototherapy, or in severe cases biologic injectable medication (Dupixent; Adbry) or Oral JAK inhibitors.  Treatment Plan: Condition has been resistant to numerous treatments. [name]  is the only topical treatment that has been helpful without the risk of atrophy and dyspigmentation. If opzelura  is not approved, only other option would be a biologic such as Dupixent which would likely be more expensive.  Continue sample  of [name]  cream 1-2 times daily as needed until smooth Lot [id number] Exp 06/08/2024 We will start a prior authorization for [name]  cream  Recommend gentle skin care.   Return in about 5 months (around 02/13/2024) for Dermatitis .  IClara Crisp, CMA, am acting as scribe for Harris Liming, MD .   Documentation: I have reviewed the above documentation for accuracy and completeness, and I agree with the above.  Harris Liming, MD

## 2023-09-13 NOTE — Patient Instructions (Signed)

## 2023-09-19 ENCOUNTER — Ambulatory Visit: Payer: Medicaid Other | Admitting: Dermatology

## 2023-10-24 ENCOUNTER — Ambulatory Visit: Admitting: Obstetrics & Gynecology

## 2023-11-07 ENCOUNTER — Ambulatory Visit: Admitting: Obstetrics & Gynecology

## 2023-11-08 ENCOUNTER — Other Ambulatory Visit (HOSPITAL_COMMUNITY)
Admission: RE | Admit: 2023-11-08 | Discharge: 2023-11-08 | Disposition: A | Discharge status: Home / Self Care | Source: Ambulatory Visit | Attending: Certified Nurse Midwife | Admitting: Certified Nurse Midwife

## 2023-11-08 ENCOUNTER — Ambulatory Visit (INDEPENDENT_AMBULATORY_CARE_PROVIDER_SITE_OTHER): Admitting: Certified Nurse Midwife

## 2023-11-08 ENCOUNTER — Encounter: Payer: Self-pay | Admitting: Certified Nurse Midwife

## 2023-11-08 VITALS — BP 110/68 | HR 82 | Wt 136.4 lb

## 2023-11-08 DIAGNOSIS — Z01419 Encounter for gynecological examination (general) (routine) without abnormal findings: Secondary | ICD-10-CM

## 2023-11-08 DIAGNOSIS — R87610 Atypical squamous cells of undetermined significance on cytologic smear of cervix (ASC-US): Secondary | ICD-10-CM | POA: Diagnosis present

## 2023-11-08 DIAGNOSIS — R87810 Cervical high risk human papillomavirus (HPV) DNA test positive: Secondary | ICD-10-CM | POA: Diagnosis present

## 2023-11-08 DIAGNOSIS — Z113 Encounter for screening for infections with a predominantly sexual mode of transmission: Secondary | ICD-10-CM | POA: Diagnosis not present

## 2023-11-08 NOTE — Progress Notes (Signed)
 Patient presents for Annual.  LMP: No LMP recorded. (Menstrual status: IUD).  Last pap: Date: 10/20/22 Contraception: IUD: kyleena  Mammogram: Not yet indicated STD Screening: Declines Flu Vaccine : N/A  CC: Annual

## 2023-11-08 NOTE — Progress Notes (Signed)
   Subjective:     Ashley Herman is a 28 y.o. female here at [facility] for a routine exam.  Current complaints: None. Just wanted to develop plan going forward for abnormal pap hx and discuss IUD.  Personal and family health history reviewed: yes.  Do you have a primary care provider? Yes Do you feel safe at home? Yes  Flowsheet Row Office Visit from 11/08/2023 in [facility] Total Score 0    Health Maintenance Due  Topic Date Due   Hepatitis B Vaccines (1 of 3 - 19+ 3-dose series) Never done   COVID-19 Vaccine (3 - 2024-25 season) 12/11/2022     Risk factors for chronic health problems: Smoking: Alchohol/how much: Pt BMI: Body mass index is 25.77 kg/m.   Gynecologic History No LMP recorded. (Menstrual status: IUD). Contraception: IUD Last Pap: 10/2022. Results were: abnormal Last mammogram: Was in [place]  in 2022 due to having fibrocystic breast. Results were: Normal  Obstetric History OB History  Gravida Para Term Preterm AB Living  0 0 0 0 0 0  SAB IAB Ectopic Multiple Live Births  0 0 0 0 0     The following portions of the patient's history were reviewed and updated as appropriate: allergies, current medications, past family history, past medical history, past social history, past surgical history, and problem list.  Review of Systems Pertinent items noted in HPI and remainder of comprehensive ROS otherwise negative.    Objective:   Today's Vitals   11/08/23 1311  BP: 110/68  Pulse: 82  Weight: 61.9 kg   Body mass index is 25.77 kg/m.  VS reviewed, nursing note reviewed,  Constitutional: well developed, well nourished, no distress HEENT: normocephalic, thyroid without enlargement or mass HEART: RRR, no murmurs rubs/gallops RESP: clear and equal to auscultation bilaterally in all lobes  Breast Exam:  performed: right breast normal without mass, skin or nipple changes or axillary nodes, left  breast normal without mass, skin or nipple changes or axillary nodes Abdomen: soft Neuro: alert and oriented x 3 Skin: warm, dry Psych: affect normal Pelvic exam:  Performed: Cervix pink, visually closed, without lesion, scant white creamy discharge, vaginal walls and external genitalia normal Bimanual exam: Cervix 0/long/high, firm, anterior, neg CMT, uterus nontender, nonenlarged, adnexa without tenderness, enlargement, or mass        Assessment/Plan:   1. Well woman exam with routine gynecological exam (Primary) Patient planning to relocate. Patient advised to find new GYN in new location. Follow up PRN  Kyleena IUD strings visualized during pap.  2. Screening for STD (sexually transmitted disease) Declined testing today  3. ASCUS with positive high risk HPV cervical Pap smear completed today. Will discuss results and follow upon results.      Return in about 1 year (around 11/07/2024) for [place]. Or PRN  Derrek DOROTHA Freund, NP Student 2:05 PM

## 2023-11-15 LAB — CYTOLOGY - PAP
Adequacy: ABSENT
Comment: NEGATIVE
Diagnosis: NEGATIVE
High risk HPV: NEGATIVE

## 2023-12-20 ENCOUNTER — Other Ambulatory Visit: Payer: Self-pay

## 2023-12-20 DIAGNOSIS — L219 Seborrheic dermatitis, unspecified: Secondary | ICD-10-CM

## 2023-12-20 DIAGNOSIS — L209 Atopic dermatitis, unspecified: Secondary | ICD-10-CM

## 2023-12-20 MED ORDER — OPZELURA 1.5 % EX CREA: TOPICAL_CREAM | CUTANEOUS | 1 refills | Status: DC

## 2023-12-20 NOTE — Progress Notes (Signed)
 [name]  is being denied again, RX sent to [facility] for non covered price. Aw

## 2024-01-02 ENCOUNTER — Ambulatory Visit: Admitting: Family

## 2024-01-02 ENCOUNTER — Telehealth: Payer: Self-pay

## 2024-01-02 NOTE — Telephone Encounter (Signed)
 Noted. Pt showed up 12 mins late. Per Ginger she needed to reschedule. The front desk was made aware of this.

## 2024-01-02 NOTE — Telephone Encounter (Signed)
 Copied from CRM [id number]. Topic: General - Running Late >> Jan 02, 2024  1:19 PM Roselie BROCKS wrote: Patient/patient representative is calling because they are running late for an appointment.   Patient will be 10 to 15 minutes late

## 2024-01-22 ENCOUNTER — Encounter: Payer: Self-pay | Admitting: Family

## 2024-01-22 ENCOUNTER — Ambulatory Visit: Admitting: Family

## 2024-01-22 VITALS — BP 114/80 | HR 89 | Temp 98.7°F | Ht 61.0 in | Wt 135.2 lb

## 2024-01-22 DIAGNOSIS — Z113 Encounter for screening for infections with a predominantly sexual mode of transmission: Secondary | ICD-10-CM | POA: Diagnosis not present

## 2024-01-22 DIAGNOSIS — Z862 Personal history of diseases of the blood and blood-forming organs and certain disorders involving the immune mechanism: Secondary | ICD-10-CM

## 2024-01-22 DIAGNOSIS — J3089 Other allergic rhinitis: Secondary | ICD-10-CM

## 2024-01-22 DIAGNOSIS — N898 Other specified noninflammatory disorders of vagina: Secondary | ICD-10-CM | POA: Diagnosis not present

## 2024-01-22 DIAGNOSIS — Z8742 Personal history of other diseases of the female genital tract: Secondary | ICD-10-CM

## 2024-01-22 DIAGNOSIS — R739 Hyperglycemia, unspecified: Secondary | ICD-10-CM

## 2024-01-22 DIAGNOSIS — E559 Vitamin D deficiency, unspecified: Secondary | ICD-10-CM | POA: Diagnosis not present

## 2024-01-22 LAB — TIQ- AMBIGUOUS ORDER

## 2024-01-22 LAB — BASIC METABOLIC PANEL WITH GFR
BUN: 12 mg/dL (ref 6–23)
CO2: 27 meq/L (ref 19–32)
Calcium: 9.6 mg/dL (ref 8.4–10.5)
Chloride: 101 meq/L (ref 96–112)
Creatinine, Ser: 0.84 mg/dL (ref 0.40–1.20)
GFR: 95.36 mL/min (ref >60.00)
Glucose, Bld: 93 mg/dL (ref 70–99)
Potassium: 4.3 meq/L (ref 3.5–5.1)
Sodium: 137 meq/L (ref 135–145)

## 2024-01-22 LAB — CBC
HCT: 42.5 % (ref 36.0–46.0)
Hemoglobin: 13.6 g/dL (ref 12.0–15.0)
MCHC: 31.9 g/dL (ref 30.0–36.0)
MCV: 84.6 fl (ref 78.0–100.0)
Platelets: 407 K/uL — ABNORMAL HIGH (ref 150.0–400.0)
RBC: 5.03 Mil/uL (ref 3.87–5.11)
RDW: 13.9 % (ref 11.5–15.5)
WBC: 5 K/uL (ref 4.0–10.5)

## 2024-01-22 LAB — HEMOGLOBIN A1C: Hgb A1c MFr Bld: 5.7 % (ref 4.6–6.5)

## 2024-01-22 LAB — VITAMIN D 25 HYDROXY (VIT D DEFICIENCY, FRACTURES): VITD: 30.19 ng/mL (ref 30.00–100.00)

## 2024-01-22 MED ORDER — FLUTICASONE PROPIONATE 50 MCG/ACT NA SUSP: 2.0000 | Freq: Every day | NASAL | 6 refills | Status: AC

## 2024-01-22 NOTE — Progress Notes (Signed)
 Established Patient Office Visit  Subjective:      CC:  Chief Complaint  Patient presents with   Medical Management of Chronic Issues    Would like to have blood work done, she is moving to [facility] in a few weeks.    HPI: Ashley Herman is a 28 y.o. female presenting on 01/22/2024 for Medical Management of Chronic Issues (Would like to have blood work done, she is moving to [facility] in a few weeks.)   Discussed the use of AI scribe software for clinical note transcription with the patient, who gave verbal consent to proceed.  History of Present Illness Ashley Herman is a 28 year old female who presents for blood work and STD testing.  She seeks blood work and STD testing as it has been a long time since her last check. She recalls having blood work done in May 2025 at her GYN but not with the current provider. She is interested in testing for syphilis, HIV, hepatitis, gonorrhea, chlamydia, trichomonas, and herpes. She has no current symptoms suggestive of an STD but mentions a possible yeast infection due to some vaginal discharge that was present last week but has since resolved. The discharge was described as light and clear.  She reports a history of allergies and has experienced puffy, burning eyes over the past week, which she attributes to her allergies. Her eyes are watery, and she has noticed some crusting in the morning. She previously used Flonase , which helped with nasal congestion, and requests a refill. She has not been taking Allegra regularly and is considering restarting it along with flonase .  Her past medical history includes an abnormal Pap smear last year. This year, her Pap smear results were normal. She has never had a positive STD test in the past.  She is planning to move to [facility], [place] , to live with her boyfriend.          Social history:  Relevant past medical, surgical, family and social history reviewed and updated as indicated. Interim medical history  since our last visit reviewed.  Allergies and medications reviewed and updated.  DATA REVIEWED: CHART IN EPIC     ROS: Negative unless specifically indicated above in HPI.    Current Outpatient Medications:    Crisaborole  (EUCRISA ) 2 % OINT, Apply twice daily to affected areas, Disp: 60 g, Rfl: 2   Fluocinolone  Acetonide Scalp (DERMA-SMOOTHE /FS SCALP) 0.01 % OIL, Apply once or twice daily to affected areas on scalp as needed for itching., Disp: 120 mL, Rfl: 4   hydrocortisone  2.5 % cream, Apply twice daily until smooth, Disp: 30 g, Rfl: 2   ketoconazole  (NIZORAL ) 2 % shampoo, 2-3 times per week lather on scalp, leave on 8-10 minutes, rinse well., Disp: 120 mL, Rfl: 11   metroNIDAZOLE  (METROGEL ) 1 % gel, Apply topically daily., Disp: 45 g, Rfl: 0   pimecrolimus  (ELIDEL ) 1 % cream, Apply twice daily to affected areas on face., Disp: 30 g, Rfl: 2   Roflumilast  (ZORYVE ) 0.3 % CREA, Apply once daily to affected areas on face, Disp: 60 g, Rfl: 2   Ruxolitinib Phosphate  ([name] ) 1.5 % CREA, Apply twice daily to affected areas on face, Disp: 60 g, Rfl: 2   Ruxolitinib Phosphate  ([name] ) 1.5 % CREA, Apply to aa face 1-2 times daily as needed, Disp: 60 g, Rfl: 1   fluticasone  (FLONASE ) 50 MCG/ACT nasal spray, Place 2 sprays into both nostrils daily., Disp: 16 g, Rfl: 6        Objective:  BP 114/80 (BP Location: Left Arm, Patient Position: Sitting, Cuff Size: Normal)   Pulse 89   Temp 98.7 F (37.1 C) (Temporal)   Ht 5' 1 (1.549 m)   Wt 135 lb 3.2 oz (61.3 kg)   SpO2 98%   BMI 25.55 kg/m   Physical Exam HEENT: Nasal mucosa swollen. Periorbital area swollen.  Wt Readings from Last 3 Encounters:  01/22/24 135 lb 3.2 oz (61.3 kg)  11/08/23 136 lb 6.4 oz (61.9 kg)  04/24/23 140 lb (63.5 kg)    Physical Exam Constitutional:      General: She is not in acute distress.    Appearance: Normal appearance. She is normal weight. She is not ill-appearing, toxic-appearing or  diaphoretic.  HENT:     Head: Normocephalic.     Right Ear: Tympanic membrane normal.     Left Ear: Tympanic membrane normal.     Nose: Nose normal.     Right Turbinates: Swollen.     Left Turbinates: Swollen.     Mouth/Throat:     Mouth: Mucous membranes are dry.     Pharynx: No oropharyngeal exudate or posterior oropharyngeal erythema.  Eyes:     General: Allergic shiner present.     Extraocular Movements: Extraocular movements intact.     Pupils: Pupils are equal, round, and reactive to light.  Cardiovascular:     Rate and Rhythm: Normal rate and regular rhythm.     Pulses: Normal pulses.     Heart sounds: Normal heart sounds.  Pulmonary:     Effort: Pulmonary effort is normal.     Breath sounds: Normal breath sounds.  Musculoskeletal:     Cervical back: Normal range of motion.  Neurological:     General: No focal deficit present.     Mental Status: She is alert and oriented to person, place, and time. Mental status is at baseline.  Psychiatric:        Mood and Affect: Mood normal.        Behavior: Behavior normal.        Thought Content: Thought content normal.        Judgment: Judgment normal.          Results PATHOLOGY Pap smear: Negative  Assessment & Plan:   Assessment and Plan Assessment & Plan Adult Wellness Visit Routine wellness visit requested to ensure good health before moving to [place] . Several health concerns discussed, including vaginal discharge, possible yeast infection, and allergy symptoms. - Order comprehensive blood work to assess overall health status.  Sexually transmitted infection screening Request for comprehensive STI screening including syphilis, HIV, hepatitis, gonorrhea, chlamydia, trichomonas, and herpes. No symptoms suggestive of STI reported. Previous Pap smear was negative. Discussed herpes testing, noting it is not routinely done unless symptomatic due to potential for dormant infection and lack of outbreak history. - Perform  blood work for STI screening including syphilis, HIV, hepatitis, gonorrhea, chlamydia, trichomonas, and herpes. Safe sex d/w pt .     Vaginal discharge, evaluation for possible yeast infection Intermittent clear vaginal discharge noted last week, currently resolved. Possible yeast infection considered. No current symptoms of vaginal discharge reported. - Perform wet prep to check for yeast and bacterial vaginitis.  Allergic rhinitis and allergic conjunctivitis Puffy, burning eyes and nasal congestion suggestive of allergic rhinitis and conjunctivitis. Symptoms include swollen nasal membranes and watery eyes. No sinus infection symptoms reported. Previous use of Flonase  was beneficial. - Restart Flonase  nasal spray. - Start allegra daily for a few weeks  to manage allergy symptoms. - Apply warm compresses to eyes to reduce swelling.        Return in about 1 year (around 01/21/2025) for f/u CPE.     Ginger Patrick, MSN, APRN, FNP-C [facility]

## 2024-01-23 ENCOUNTER — Ambulatory Visit: Payer: Self-pay | Admitting: Family

## 2024-01-23 ENCOUNTER — Telehealth: Payer: Self-pay | Admitting: Family

## 2024-01-23 LAB — HIV ANTIBODY (ROUTINE TESTING W REFLEX)
HIV 1&2 Ab, 4th Generation: NONREACTIVE
HIV FINAL INTERPRETATION: NEGATIVE

## 2024-01-23 LAB — CHLAMYDIA/GONOCOCCUS/TRICHOMONAS, NAA
Chlamydia by NAA: NEGATIVE
Gonococcus by NAA: NEGATIVE
Trich vag by NAA: NEGATIVE

## 2024-01-23 LAB — RPR: RPR Ser Ql: NONREACTIVE

## 2024-01-23 LAB — HEPATITIS C ANTIBODY: Hepatitis C Ab: NONREACTIVE

## 2024-01-23 NOTE — Telephone Encounter (Signed)
 Copied from CRM [id number]. Topic: Clinical - Request for Lab/Test Order >> Jan 23, 2024  9:17 AM Laymon HERO wrote: Reason for CRM: [facility] does not offer the Herpes 1&2 IGM- they only offer the IGG- [facility] #[phone] reference #[id number] E

## 2024-01-26 LAB — HERPES SIMPLEX VIRUS 1 AND 2 (IGG),REFLEX HSV-2 INHIBITION
HSV 1 IGG,TYPE SPECIFIC AB: <0.9 {index}
HSV 2 IGG,TYPE SPECIFIC AB: <0.9 {index}

## 2024-01-26 LAB — TEST AUTHORIZATION

## 2024-01-26 LAB — WET PREP BY MOLECULAR PROBE
Candida species: NOT DETECTED
Gardnerella vaginalis: NOT DETECTED
MICRO NUMBER:: 17091331
SPECIMEN QUALITY:: ADEQUATE
Trichomonas vaginosis: NOT DETECTED

## 2024-02-02 ENCOUNTER — Encounter: Payer: Self-pay | Admitting: Cardiology

## 2024-02-02 ENCOUNTER — Ambulatory Visit: Attending: Cardiology | Admitting: Cardiology

## 2024-02-02 VITALS — BP 100/65 | HR 81 | Ht 61.0 in | Wt 136.4 lb

## 2024-02-02 DIAGNOSIS — I471 Supraventricular tachycardia, unspecified: Secondary | ICD-10-CM | POA: Diagnosis not present

## 2024-02-02 NOTE — Patient Instructions (Signed)
 Medication Instructions:  Your physician recommends that you continue on your current medications as directed. Please refer to the Current Medication list given to you today.   *If you need a refill on your cardiac medications before your next appointment, please call your pharmacy*  Lab Work: No labs ordered today  If you have labs (blood work) drawn today and your tests are completely normal, you will receive your results only by: MyChart Message (if you have MyChart) OR A paper copy in the mail If you have any lab test that is abnormal or we need to change your treatment, we will call you to review the results.  Testing/Procedures: No test ordered today   Follow-Up: At [facility], you and your health needs are our priority.  As part of our continuing mission to provide you with exceptional heart care, our providers are all part of one team.  This team includes your primary Cardiologist (physician) and Advanced Practice Providers or APPs (Physician Assistants and Nurse Practitioners) who all work together to provide you with the care you need, when you need it.  Your next appointment:   Follow up as needed  Provider:   You may see Redell Cave, MD or one of the following Advanced Practice Providers on your designated Care Team:   Lonni Meager, NP Lesley Maffucci, PA-C Bernardino Bring, PA-C Cadence Kwethluk, PA-C Tylene Lunch, NP Barnie Hila, NP    We recommend signing up for the patient portal called MyChart.  Sign up information is provided on this After Visit Summary.  MyChart is used to connect with patients for Virtual Visits (Telemedicine).  Patients are able to view lab/test results, encounter notes, upcoming appointments, etc.  Non-urgent messages can be sent to your provider as well.   To learn more about what you can do with MyChart, go to [email or web address].

## 2024-02-02 NOTE — Progress Notes (Signed)
 Cardiology Office Note:    Date:  02/02/2024   ID:  Ashley Herman, DOB [birth year], MRN [id number]  PCP:  Corwin Antu, FNP   [facility] Providers Cardiologist:  Redell Cave, MD     Referring MD: Corwin Antu, FNP   Chief Complaint  Patient presents with   26 MONTH FOLLOW-UP    Pt doing okay.    History of Present Illness:    Ashley Herman is a 28 y.o. 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  Doing okay, denies palpitations, dizziness, presyncope or syncope.  Relocating to [facility] [place]  next month.  Overall doing okay, no concerns at this time.  Prior notes/testing Echo 05/2022 EF 60 to 65% Cardiac monitor 05/2022 no significant arrhythmias.   Past Medical History:  Diagnosis Date   Anemia    H/O prior ablation treatment 05/26/2020   [place]     Past Surgical History:  Procedure Laterality Date   ABLATION     Cardiac Ablation   BUNIONECTOMY Right     Current Medications: Current Meds  Medication Sig   Crisaborole  (EUCRISA ) 2 % OINT Apply twice daily to affected areas   fluticasone  (FLONASE ) 50 MCG/ACT nasal spray Place 2 sprays into both nostrils daily.   hydrocortisone  2.5 % cream Apply twice daily until smooth   ketoconazole  (NIZORAL ) 2 % shampoo 2-3 times per week lather on scalp, leave on 8-10 minutes, rinse well.   Roflumilast  (ZORYVE ) 0.3 % CREA Apply once daily to affected areas on face   Ruxolitinib Phosphate  ([name] ) 1.5 % CREA Apply twice daily to affected areas on face   Ruxolitinib Phosphate  ([name] ) 1.5 % CREA Apply to aa face 1-2 times daily as needed     Allergies:   Bacitracin and Nickel   Social History   Socioeconomic History   Marital status: Single    Spouse name: Not on file   Number of children: Not on file   Years of education: Not on file   Highest education level: Some college, no degree  Occupational History   Occupation: daycare  Tobacco Use   Smoking  status: Never    Passive exposure: Never   Smokeless tobacco: Never  Vaping Use   Vaping status: Never Used  Substance and Sexual Activity   Alcohol use: No    Comment: occasional   Drug use: No   Sexual activity: Yes    Partners: Male    Birth control/protection: I.U.D.  Other Topics Concern   Not on file  Social History Narrative   Dating a boyfriend since September    Social Drivers of Health   Financial Resource Strain: Low Risk  (01/01/2024)   Overall Financial Resource Strain (CARDIA)    Difficulty of Paying Living Expenses: Not hard at all  Food Insecurity: No Food Insecurity (01/01/2024)   Hunger Vital Sign    Worried About Running Out of Food in the Last Year: Never true    Ran Out of Food in the Last Year: Never true  Transportation Needs: No Transportation Needs (01/01/2024)   PRAPARE - Administrator, Civil Service (Medical): No    Lack of Transportation (Non-Medical): No  Physical Activity: Insufficiently Active (01/01/2024)   Exercise Vital Sign    Days of Exercise per Week: 4 days    Minutes of Exercise per Session: 30 min  Stress: No Stress Concern Present (01/01/2024)   [employer] of Occupational Health - Occupational  Stress Questionnaire    Feeling of Stress: Not at all  Social Connections: Moderately Isolated (01/01/2024)   Social Connection and Isolation Panel    Frequency of Communication with Friends and Family: More than three times a week    Frequency of Social Gatherings with Friends and Family: Twice a week    Attends Religious Services: 1 to 4 times per year    Active Member of [employer] or Organizations: No    Attends Engineer, structural: Not on file    Marital Status: Never married     Family History: The patient's family history includes Clotting disorder in her father; Healthy in her mother; Leukemia in her father; Parkinson's disease in her father.  ROS:   Please see the history of present illness.     All other  systems reviewed and are negative.  EKGs/Labs/Other Studies Reviewed:    The following studies were reviewed today:   EKG Interpretation Date/Time:  Friday February 02 2024 16:05:13 EDT Ventricular Rate:  81 PR Interval:  164 QRS Duration:  74 QT Interval:  362 QTC Calculation: 420 R Axis:   81  Text Interpretation: Normal sinus rhythm Possible Left atrial enlargement Confirmed by Darliss Rogue ([id number]) on 02/02/2024 4:12:23 PM    Recent Labs: 01/22/2024: BUN 12; Creatinine, Ser 0.84; Hemoglobin 13.6; Platelets 407.0; Potassium 4.3; Sodium 137  Recent Lipid Panel    Component Value Date/Time   CHOL 168 06/30/2022 0926   TRIG 56.0 06/30/2022 0926   HDL 73.00 06/30/2022 0926   CHOLHDL 2 06/30/2022 0926   VLDL 11.2 06/30/2022 0926   LDLCALC 83 06/30/2022 0926     Risk Assessment/Calculations:             Physical Exam:    VS:  BP 100/65 (BP Location: Left Arm, Patient Position: Sitting, Cuff Size: Normal)   Pulse 81 Comment: 88 oximeter  Ht 5' 1 (1.549 m)   Wt 136 lb 6.4 oz (61.9 kg)   SpO2 98%   BMI 25.77 kg/m     Wt Readings from Last 3 Encounters:  02/02/24 136 lb 6.4 oz (61.9 kg)  01/22/24 135 lb 3.2 oz (61.3 kg)  11/08/23 136 lb 6.4 oz (61.9 kg)     GEN:  Well nourished, well developed in no acute distress HEENT: Normal NECK: No JVD; No carotid bruits CARDIAC: RRR, no murmurs, rubs, gallops RESPIRATORY:  Clear to auscultation without rales, wheezing or rhonchi  ABDOMEN: Soft, non-tender, non-distended MUSCULOSKELETAL:  No edema; left chest tenderness on palpation. SKIN: Warm and dry NEUROLOGIC:  Alert and oriented x 3 PSYCHIATRIC:  Normal affect   ASSESSMENT:    1. SVT (supraventricular tachycardia)    PLAN:    In order of problems listed above:  History of SVT s/p RFA 05/2020.  Echo 05/2022 EF 60 to 65%.   Cardiac monitor showed no significant arrhythmias.  Echocardiogram with normal EF 60 to 65%.  Previous palpitations occurred in the  context of drinking energy drinks.  Advised to avoid stimulants/energy drinks.    Follow-up if in the area in about 1-2 years      Medication Adjustments/Labs and Tests Ordered: Current medicines are reviewed at length with the patient today.  Concerns regarding medicines are outlined above.  Orders Placed This Encounter  Procedures   EKG 12-Lead   EKG 12-Lead   No orders of the defined types were placed in this encounter.   Patient Instructions  Medication Instructions:  Your physician recommends that you continue  on your current medications as directed. Please refer to the Current Medication list given to you today.   *If you need a refill on your cardiac medications before your next appointment, please call your pharmacy*  Lab Work: No labs ordered today  If you have labs (blood work) drawn today and your tests are completely normal, you will receive your results only by: MyChart Message (if you have MyChart) OR A paper copy in the mail If you have any lab test that is abnormal or we need to change your treatment, we will call you to review the results.  Testing/Procedures: No test ordered today   Follow-Up: At [facility], you and your health needs are our priority.  As part of our continuing mission to provide you with exceptional heart care, our providers are all part of one team.  This team includes your primary Cardiologist (physician) and Advanced Practice Providers or APPs (Physician Assistants and Nurse Practitioners) who all work together to provide you with the care you need, when you need it.  Your next appointment:   Follow up as needed  Provider:   You may see Redell Cave, MD or one of the following Advanced Practice Providers on your designated Care Team:   Lonni Meager, NP Lesley Maffucci, PA-C Bernardino Bring, PA-C Cadence Kimberly, PA-C Tylene Lunch, NP Barnie Hila, NP    We recommend signing up for the patient portal called MyChart.   Sign up information is provided on this After Visit Summary.  MyChart is used to connect with patients for Virtual Visits (Telemedicine).  Patients are able to view lab/test results, encounter notes, upcoming appointments, etc.  Non-urgent messages can be sent to your provider as well.   To learn more about what you can do with MyChart, go to [email or web address].             Signed, Redell Cave, MD  02/02/2024 5:12 PM    [facility]

## 2024-02-13 ENCOUNTER — Ambulatory Visit: Admitting: Dermatology

## 2024-02-20 ENCOUNTER — Ambulatory Visit: Admitting: Dermatology

## 2024-02-20 ENCOUNTER — Encounter: Payer: Self-pay | Admitting: Dermatology

## 2024-02-20 DIAGNOSIS — L305 Pityriasis alba: Secondary | ICD-10-CM

## 2024-02-20 DIAGNOSIS — L219 Seborrheic dermatitis, unspecified: Secondary | ICD-10-CM | POA: Diagnosis not present

## 2024-02-20 DIAGNOSIS — L209 Atopic dermatitis, unspecified: Secondary | ICD-10-CM | POA: Diagnosis not present

## 2024-02-20 MED ORDER — DESONIDE 0.05 % EX CREA: TOPICAL_CREAM | Freq: Two times a day (BID) | CUTANEOUS | 2 refills | Status: AC | PRN

## 2024-02-20 MED ORDER — OPZELURA 1.5 % EX CREA: TOPICAL_CREAM | CUTANEOUS | 1 refills | Status: AC

## 2024-02-20 MED ORDER — HYDROCORTISONE 2.5 % EX CREA: TOPICAL_CREAM | CUTANEOUS | 2 refills | Status: AC

## 2024-02-20 NOTE — Patient Instructions (Signed)

## 2024-02-20 NOTE — Progress Notes (Signed)
 Subjective   Ashley Herman is a 28 y.o. female who presents for the following: Follow up of atopic dermatitis. Patient is established patient .  Today patient reports: Patient states she is flaring pretty bad, requesting refills on hydrocortisone  and using [name]  daily. Patient states she is moving at the end of the month to [place] . Patient reports itching at back.  Review of Systems:    No other skin or systemic complaints except as noted in HPI or Assessment and Plan.  The following portions of the chart were reviewed this encounter and updated as appropriate: medications, allergies, medical history  Relevant Medical History:  n/a   Objective  (SKPE) Well appearing patient in no apparent distress; mood and affect are within normal limits. Examination was performed of the: Focused Exam of: Face, back   Examination notable for: hypopigmented patches forehead cheeks perioral. 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 Condition is symptomatic and bothersome to patient. Patient is flaring and not currently at treatment goal.   - Diagnosis, treatment options, prognosis, risk/ benefit, and side effects of treatment were discussed with the patient.  - Reviewed benign but chronic nature of disease. - Discussed dry skin care at length, recommended avoidance of fragrances, short showers with luke- warm water,  no scrubbing, an unscented moisturizing soap (e.g. Dove sensitive skin) limited to the groin and axillae, and frequent emollient use (Eucerin, Aquaphor, Cerave, Vanicream, Vaseline). - Discussed treatment with topical steroids, non steroidal topicals, systemics (dupixent, tralokinumab, nemolizumab, rinvoq)   - Cyclosporine, mycophenolate, azathioprine, methotrexate are older medications but can be considered  - Reviewed proper use of topical steroids to minimize the risk of steroid-induced skin changes.  - Also discussed appropriate dry skin care including daily warm baths with gentle soap, followed by liberal bland moisturizer application.  - Start desonide 0.05% cream apply BID to aa, trunk  prn rash. Safe to use on face no more than one week at a time. Patient advised to call us  if too expensive and can send alternative.  - Continue [name]  apply 1-2 times daily as needed until smooth on face - Continue hydrocortisone  2.5% cream apply BID until skin smooth and flat. On face - Samples of Vanicream and Neutrogena HydroBoost given to patient.      Level of service outlined above   Patient instructions (SKPI)   Procedures, orders, diagnosis for this visit:  ATOPIC DERMATITIS, UNSPECIFIED TYPE   Related Medications Ruxolitinib Phosphate  ([name] ) 1.5 % CREA Apply twice daily to affected areas on face Crisaborole  (EUCRISA ) 2 % OINT Apply twice daily to affected areas Ruxolitinib Phosphate  ([name] ) 1.5 % CREA Apply to aa face 1-2 times daily as needed hydrocortisone  2.5 % cream Apply twice daily until smooth SEBORRHEIC DERMATITIS   Related Medications Ruxolitinib Phosphate  ([name] ) 1.5 % CREA Apply twice daily to affected areas on face ketoconazole  (NIZORAL ) 2 % shampoo 2-3 times per week lather on scalp, leave on 8-10 minutes, rinse well. hydrocortisone  2.5 % cream  Apply twice daily until smooth  Atopic dermatitis, unspecified type -     [name] ; Apply to aa face 1-2  times daily as needed  Dispense: 60 g; Refill: 1 -     Hydrocortisone ; Apply twice daily until smooth  Dispense: 30 g; Refill: 2  Seborrheic dermatitis -     Hydrocortisone ; Apply twice daily until smooth  Dispense: 30 g; Refill: 2  Other orders -     Desonide; Apply topically 2 (two) times daily as needed (Rash).  Dispense: 60 g; Refill: 2    Return to clinic: Return if symptoms worsen or fail to improve.  I, Jacquelynn V. Wilfred, CMA, am acting as scribe for Boneta Sharps, MD.  Documentation: I have reviewed the above documentation for accuracy and completeness, and I agree with the above.  Boneta Sharps, MD
